# Patient Record
Sex: Female | Born: 1970 | Race: White | Hispanic: No | Smoking: Current every day smoker
Health system: Southern US, Community
[De-identification: ages and names within clinical notes are randomized; demographics above are authoritative.]

## PROBLEM LIST (undated history)

## (undated) DIAGNOSIS — F32A Depression, unspecified: Secondary | ICD-10-CM

## (undated) DIAGNOSIS — F419 Anxiety disorder, unspecified: Secondary | ICD-10-CM

## (undated) DIAGNOSIS — I10 Essential (primary) hypertension: Secondary | ICD-10-CM

## (undated) HISTORY — DX: Essential (primary) hypertension: I10

## (undated) HISTORY — DX: Anxiety disorder, unspecified: F41.9

## (undated) HISTORY — DX: Depression, unspecified: F32.A

## (undated) HISTORY — PX: ABDOMINAL HYSTERECTOMY: SHX81

---

## 2020-05-15 ENCOUNTER — Encounter (HOSPITAL_COMMUNITY): Payer: Self-pay | Admitting: Emergency Medicine

## 2020-05-15 ENCOUNTER — Emergency Department (HOSPITAL_COMMUNITY)
Admission: EM | Admit: 2020-05-15 | Discharge: 2020-05-16 | Disposition: A | Discharge status: Home / Self Care | Payer: BLUE CROSS/BLUE SHIELD | Source: Home / Self Care | Attending: Emergency Medicine | Admitting: Emergency Medicine

## 2020-05-15 ENCOUNTER — Other Ambulatory Visit: Payer: Self-pay

## 2020-05-15 DIAGNOSIS — R6510 Systemic inflammatory response syndrome (SIRS) of non-infectious origin without acute organ dysfunction: Secondary | ICD-10-CM | POA: Diagnosis not present

## 2020-05-15 DIAGNOSIS — Z79899 Other long term (current) drug therapy: Secondary | ICD-10-CM | POA: Insufficient documentation

## 2020-05-15 DIAGNOSIS — T424X1A Poisoning by benzodiazepines, accidental (unintentional), initial encounter: Secondary | ICD-10-CM | POA: Diagnosis not present

## 2020-05-15 DIAGNOSIS — F332 Major depressive disorder, recurrent severe without psychotic features: Secondary | ICD-10-CM | POA: Insufficient documentation

## 2020-05-15 DIAGNOSIS — F17210 Nicotine dependence, cigarettes, uncomplicated: Secondary | ICD-10-CM | POA: Insufficient documentation

## 2020-05-15 DIAGNOSIS — T50901A Poisoning by unspecified drugs, medicaments and biological substances, accidental (unintentional), initial encounter: Secondary | ICD-10-CM | POA: Insufficient documentation

## 2020-05-15 DIAGNOSIS — F411 Generalized anxiety disorder: Secondary | ICD-10-CM | POA: Diagnosis present

## 2020-05-15 LAB — COMPREHENSIVE METABOLIC PANEL
ALT: 23 U/L (ref 0–44)
AST: 32 U/L (ref 15–41)
Albumin: 4.6 g/dL (ref 3.5–5.0)
Alkaline Phosphatase: 104 U/L (ref 38–126)
Anion gap: 11 (ref 5–15)
BUN: 12 mg/dL (ref 6–20)
CO2: 28 mmol/L (ref 22–32)
Calcium: 9.1 mg/dL (ref 8.9–10.3)
Chloride: 103 mmol/L (ref 98–111)
Creatinine, Ser: 1.14 mg/dL — ABNORMAL HIGH (ref 0.44–1.00)
GFR calc Af Amer: >60 mL/min (ref >60)
GFR calc non Af Amer: 56 mL/min — ABNORMAL LOW (ref >60)
Glucose, Bld: 152 mg/dL — ABNORMAL HIGH (ref 70–99)
Potassium: 3.8 mmol/L (ref 3.5–5.1)
Sodium: 142 mmol/L (ref 135–145)
Total Bilirubin: 0.4 mg/dL (ref 0.3–1.2)
Total Protein: 8.3 g/dL — ABNORMAL HIGH (ref 6.5–8.1)

## 2020-05-15 LAB — CBC WITH DIFFERENTIAL/PLATELET
Abs Immature Granulocytes: 0.19 10*3/uL — ABNORMAL HIGH (ref 0.00–0.07)
Basophils Absolute: 0.2 10*3/uL — ABNORMAL HIGH (ref 0.0–0.1)
Basophils Relative: 1 %
Eosinophils Absolute: 0 10*3/uL (ref 0.0–0.5)
Eosinophils Relative: 0 %
HCT: 41.7 % (ref 36.0–46.0)
Hemoglobin: 13.8 g/dL (ref 12.0–15.0)
Immature Granulocytes: 1 %
Lymphocytes Relative: 12 %
Lymphs Abs: 3.1 10*3/uL (ref 0.7–4.0)
MCH: 29.2 pg (ref 26.0–34.0)
MCHC: 33.1 g/dL (ref 30.0–36.0)
MCV: 88.3 fL (ref 80.0–100.0)
Monocytes Absolute: 2.1 10*3/uL — ABNORMAL HIGH (ref 0.1–1.0)
Monocytes Relative: 8 %
Neutro Abs: 20.2 10*3/uL — ABNORMAL HIGH (ref 1.7–7.7)
Neutrophils Relative %: 78 %
Platelets: 300 10*3/uL (ref 150–400)
RBC: 4.72 MIL/uL (ref 3.87–5.11)
RDW: 16.1 % — ABNORMAL HIGH (ref 11.5–15.5)
WBC: 25.8 10*3/uL — ABNORMAL HIGH (ref 4.0–10.5)
nRBC: 0 % (ref 0.0–0.2)

## 2020-05-15 LAB — RAPID URINE DRUG SCREEN, HOSP PERFORMED
Amphetamines: NOT DETECTED
Barbiturates: NOT DETECTED
Benzodiazepines: NOT DETECTED
Cocaine: NOT DETECTED
Opiates: NOT DETECTED
Tetrahydrocannabinol: NOT DETECTED

## 2020-05-15 LAB — CBG MONITORING, ED: Glucose-Capillary: 169 mg/dL — ABNORMAL HIGH (ref 70–99)

## 2020-05-15 NOTE — ED Notes (Signed)
Morrie Sheldon- daughter [phone]

## 2020-05-15 NOTE — ED Triage Notes (Signed)
Pt arrived via EMS. EMS reports that pt denies taking anything, but EMS gave pt narcan 2mg  intranasal, and pt became responsive. Pt was found unresponsive with agonal breaths. Pt's grandson (50 years old) called someone who called 911.   Pt reports to me that she took 90.5mg  of clonapin several days ago.

## 2020-05-15 NOTE — ED Provider Notes (Signed)
[facility] DEPT Provider Note   CSN: [id number] Arrival date & time: 05/15/20  2229     History Chief Complaint  Patient presents with  . Ingestion    Crystal Campos is a 49 y.o. female.  Patient presents to the emergency department after she was found unresponsive.  Patient reports that she has had a headache for couple of days and got a pill from a friend of a friend which she took tonight and then was found unresponsive by her 58-year-old grandson who had to call 911.  Patient also reports that she has been taking large doses of Klonopin to "help her sleep" over the last few days.  She becomes tearful when she mentions that she lost her favorite aunt a couple of days ago.        History reviewed. No pertinent past medical history.  There are no problems to display for this patient.   Past Surgical History:  Procedure Laterality Date  . ABDOMINAL HYSTERECTOMY       OB History   No obstetric history on file.     History reviewed. No pertinent family history.  Social History   Tobacco Use  . Smoking status: Current Every Day Smoker    Packs/day: 1.00    Types: Cigarettes  . Smokeless tobacco: Current User  Substance Use Topics  . Alcohol use: Yes    Comment: once per year  . Drug use: Never    Comment: pt reports never taking illegal substances    Home Medications Prior to Admission medications   Medication Sig Start Date End Date Taking? Authorizing Provider  ADDERALL XR 30 MG 24 hr capsule Take 30 mg by mouth daily. 05/04/20  Yes [provider]  Buprenorphine HCl-Naloxone HCl 8-2 MG FILM Place under the tongue 2 (two) times daily. 05/04/20  Yes [provider]  busPIRone (BUSPAR) 30 MG tablet Take 30 mg by mouth 2 (two) times daily. 03/14/20  Yes [provider]  celecoxib (CELEBREX) 100 MG capsule Take 100 mg by mouth 2 (two) times daily as needed for moderate pain.  12/26/19  Yes [provider]  clonazePAM (KLONOPIN) 0.5 MG tablet Take 0.5 mg by mouth 3 (three) times daily as needed for anxiety.  05/12/20  Yes [provider]  diphenhydrAMINE (BENADRYL) 25 MG tablet Take 25 mg by mouth every 6 (six) hours as needed for allergies.   Yes [provider]  fluticasone (FLONASE) 50 MCG/ACT nasal spray Place 1 spray into both nostrils daily as needed for allergies or rhinitis.   Yes [provider]  pantoprazole (PROTONIX) 40 MG tablet Take 40 mg by mouth every morning. 04/08/20  Yes [provider]  Phenyleph-CPM-DM-Aspirin (ALKA-SELTZER PLUS COLD & COUGH PO) Take 1 tablet by mouth daily as needed (cold symptoms).   Yes [provider]  sertraline (ZOLOFT) 100 MG tablet Take 200 mg by mouth daily. 02/26/20  Yes [provider]  tiZANidine (ZANAFLEX) 4 MG tablet Take 4 mg by mouth every 6 (six) hours as needed for muscle spasms.  12/30/19  Yes [provider]  traZODone (DESYREL) 50 MG tablet Take 50 mg by mouth at bedtime as needed for sleep.  03/15/20  Yes [provider]    Allergies    Compazine [prochlorperazine] and Reglan [metoclopramide]  Review of Systems   Review of Systems  Psychiatric/Behavioral: Positive for dysphoric mood and sleep disturbance.  All other systems reviewed and are negative.   Physical Exam  Updated Vital Signs BP 131/82   Pulse 92   Temp 97.6 F (36.4 C) (Oral)   Resp 15   Ht 5' 3.5" (1.613 m)   Wt 63.5 kg   LMP  (LMP Unknown)   SpO2 92%   BMI 24.41 kg/m   Physical Exam Vitals and nursing note reviewed.  Constitutional:      General: She is not in acute distress.    Appearance: Normal appearance. She is well-developed.  HENT:     Head: Normocephalic and atraumatic.     Right Ear: Hearing normal.     Left Ear: Hearing normal.     Nose: Nose normal.  Eyes:     Conjunctiva/sclera: Conjunctivae normal.     Pupils: Pupils are equal, round, and reactive to light.    Cardiovascular:     Rate and Rhythm: Regular rhythm.     Heart sounds: S1 normal and S2 normal. No murmur. No friction rub. No gallop.   Pulmonary:     Effort: Pulmonary effort is normal. No respiratory distress.     Breath sounds: Normal breath sounds.  Chest:     Chest wall: No tenderness.  Abdominal:     General: Bowel sounds are normal.     Palpations: Abdomen is soft.     Tenderness: There is no abdominal tenderness. There is no guarding or rebound. Negative signs include Murphy's sign and McBurney's sign.     Hernia: No hernia is present.  Musculoskeletal:        General: Normal range of motion.     Cervical back: Normal range of motion and neck supple.  Skin:    General: Skin is warm and dry.     Findings: No rash.  Neurological:     Mental Status: She is alert and oriented to person, place, and time.     GCS: GCS eye subscore is 4. GCS verbal subscore is 5. GCS motor subscore is 6.     Cranial Nerves: No cranial nerve deficit.     Sensory: No sensory deficit.     Coordination: Coordination normal.  Psychiatric:        Mood and Affect: Affect is tearful.        Speech: Speech normal.        Behavior: Behavior normal.     ED Results / Procedures / Treatments   Labs (all labs ordered are listed, but only abnormal results are displayed) Labs Reviewed  COMPREHENSIVE METABOLIC PANEL - Abnormal; Notable for the following components:      Result Value   Glucose, Bld 152 (*)    Creatinine, Ser 1.14 (*)    Total Protein 8.3 (*)    GFR calc non Af Amer 56 (*)    All other components within normal limits  SALICYLATE LEVEL - Abnormal; Notable for the following components:   Salicylate Lvl <7.0 (*)    All other components within normal limits  ACETAMINOPHEN LEVEL - Abnormal; Notable for the following components:   Acetaminophen (Tylenol), Serum <10 (*)    All other components within normal limits  CBC WITH DIFFERENTIAL/PLATELET - Abnormal; Notable for the following  components:   WBC 25.8 (*)    RDW 16.1 (*)    Neutro Abs 20.2 (*)    Monocytes Absolute 2.1 (*)    Basophils Absolute 0.2 (*)    Abs Immature Granulocytes 0.19 (*)    All other components within normal limits  CBG MONITORING, ED - Abnormal; Notable for the following  components:   Glucose-Capillary 169 (*)    All other components within normal limits  ETHANOL  RAPID URINE DRUG SCREEN, HOSP PERFORMED    EKG EKG Interpretation  Date/Time:  Sunday May 15 2020 23:00:27 EDT Ventricular Rate:  111 PR Interval:    QRS Duration: 85 QT Interval:  354 QTC Calculation: 481 R Axis:   79 Text Interpretation: Sinus tachycardia LAE, consider biatrial enlargement Low voltage, precordial leads Confirmed by Gilda Crease ([id number]) on 05/15/2020 11:09:12 PM   Radiology DG Chest Port 1 View  Result Date: 05/16/2020 CLINICAL DATA:  Found unresponsive EXAM: PORTABLE CHEST 1 VIEW COMPARISON:  None. FINDINGS: The heart size and mediastinal contours are within normal limits. Both lungs are clear. The visualized skeletal structures are unremarkable. IMPRESSION: No active disease. Electronically Signed   By: Deatra Robinson M.D.   On: 05/16/2020 01:11    Procedures Procedures (including critical care time)  Medications Ordered in ED Medications - No data to display  ED Course  I have reviewed the triage vital signs and the nursing notes.  Pertinent labs & imaging results that were available during my care of the patient were reviewed by me and considered in my medical decision making (see chart for details).    MDM Rules/Calculators/A&P                      Patient presents to the emergency department for evaluation of what appears to be an overdose.  Patient reports taking a pill that she got from someone she does not know.  She became unresponsive after taking this pill and required Narcan to become alert again.  Family reports that she does have a history of drug addiction and abuse  problems.  She reports to taking extra Klonopin but states that she has not been suicidal.  She did, however, report to me that one of her aunts that she is very close to died in the last couple of days and she is distraught over this.  Therefore had behavioral health evaluation to help determine intent of this overdose and need for follow-up.  She has been reviewed by TTS and recommended to have repeat evaluation in the morning.  Final Clinical Impression(s) / ED Diagnoses Final diagnoses:  Accidental drug overdose, initial encounter    Rx / DC Orders ED Discharge Orders    None       Manasseh Pittsley, Canary Brim, MD 05/16/20 [phone]

## 2020-05-16 ENCOUNTER — Encounter (HOSPITAL_COMMUNITY): Payer: Self-pay

## 2020-05-16 ENCOUNTER — Other Ambulatory Visit: Payer: Self-pay

## 2020-05-16 ENCOUNTER — Emergency Department (HOSPITAL_COMMUNITY): Payer: BLUE CROSS/BLUE SHIELD

## 2020-05-16 ENCOUNTER — Encounter (HOSPITAL_COMMUNITY): Payer: Self-pay | Admitting: Registered Nurse

## 2020-05-16 ENCOUNTER — Inpatient Hospital Stay (HOSPITAL_COMMUNITY)
Admission: EM | Admit: 2020-05-16 | Discharge: 2020-05-19 | DRG: 917 | Disposition: A | Discharge status: Home / Self Care | Payer: BLUE CROSS/BLUE SHIELD | Attending: Internal Medicine | Admitting: Internal Medicine

## 2020-05-16 DIAGNOSIS — F411 Generalized anxiety disorder: Secondary | ICD-10-CM | POA: Diagnosis present

## 2020-05-16 DIAGNOSIS — G92 Toxic encephalopathy: Secondary | ICD-10-CM | POA: Diagnosis present

## 2020-05-16 DIAGNOSIS — F329 Major depressive disorder, single episode, unspecified: Secondary | ICD-10-CM | POA: Diagnosis present

## 2020-05-16 DIAGNOSIS — F17210 Nicotine dependence, cigarettes, uncomplicated: Secondary | ICD-10-CM | POA: Diagnosis present

## 2020-05-16 DIAGNOSIS — R6510 Systemic inflammatory response syndrome (SIRS) of non-infectious origin without acute organ dysfunction: Secondary | ICD-10-CM | POA: Diagnosis present

## 2020-05-16 DIAGNOSIS — G5630 Lesion of radial nerve, unspecified upper limb: Secondary | ICD-10-CM | POA: Diagnosis present

## 2020-05-16 DIAGNOSIS — J9601 Acute respiratory failure with hypoxia: Secondary | ICD-10-CM | POA: Diagnosis present

## 2020-05-16 DIAGNOSIS — I2510 Atherosclerotic heart disease of native coronary artery without angina pectoris: Secondary | ICD-10-CM | POA: Diagnosis present

## 2020-05-16 DIAGNOSIS — Z79899 Other long term (current) drug therapy: Secondary | ICD-10-CM

## 2020-05-16 DIAGNOSIS — E876 Hypokalemia: Secondary | ICD-10-CM | POA: Diagnosis present

## 2020-05-16 DIAGNOSIS — J690 Pneumonitis due to inhalation of food and vomit: Secondary | ICD-10-CM

## 2020-05-16 DIAGNOSIS — D72828 Other elevated white blood cell count: Secondary | ICD-10-CM | POA: Diagnosis present

## 2020-05-16 DIAGNOSIS — Z20822 Contact with and (suspected) exposure to covid-19: Secondary | ICD-10-CM | POA: Diagnosis present

## 2020-05-16 DIAGNOSIS — D72829 Elevated white blood cell count, unspecified: Secondary | ICD-10-CM

## 2020-05-16 DIAGNOSIS — K219 Gastro-esophageal reflux disease without esophagitis: Secondary | ICD-10-CM | POA: Diagnosis present

## 2020-05-16 DIAGNOSIS — M21331 Wrist drop, right wrist: Secondary | ICD-10-CM | POA: Diagnosis present

## 2020-05-16 DIAGNOSIS — M4854XA Collapsed vertebra, not elsewhere classified, thoracic region, initial encounter for fracture: Secondary | ICD-10-CM | POA: Diagnosis present

## 2020-05-16 DIAGNOSIS — J9602 Acute respiratory failure with hypercapnia: Secondary | ICD-10-CM | POA: Diagnosis present

## 2020-05-16 DIAGNOSIS — R000 Tachycardia, unspecified: Secondary | ICD-10-CM | POA: Diagnosis present

## 2020-05-16 DIAGNOSIS — Z9081 Acquired absence of spleen: Secondary | ICD-10-CM

## 2020-05-16 DIAGNOSIS — N179 Acute kidney failure, unspecified: Secondary | ICD-10-CM | POA: Diagnosis present

## 2020-05-16 DIAGNOSIS — T424X1A Poisoning by benzodiazepines, accidental (unintentional), initial encounter: Secondary | ICD-10-CM

## 2020-05-16 DIAGNOSIS — N390 Urinary tract infection, site not specified: Secondary | ICD-10-CM | POA: Diagnosis present

## 2020-05-16 DIAGNOSIS — I700 Atherosclerosis of aorta: Secondary | ICD-10-CM | POA: Diagnosis present

## 2020-05-16 DIAGNOSIS — R739 Hyperglycemia, unspecified: Secondary | ICD-10-CM | POA: Diagnosis present

## 2020-05-16 LAB — ETHANOL: Alcohol, Ethyl (B): <10 mg/dL (ref <10)

## 2020-05-16 LAB — SALICYLATE LEVEL: Salicylate Lvl: <7 mg/dL — ABNORMAL LOW (ref 7.0–30.0)

## 2020-05-16 LAB — I-STAT BETA HCG BLOOD, ED (MC, WL, AP ONLY): I-stat hCG, quantitative: 5 m[IU]/mL — ABNORMAL HIGH (ref <5)

## 2020-05-16 LAB — ACETAMINOPHEN LEVEL: Acetaminophen (Tylenol), Serum: <10 ug/mL — ABNORMAL LOW (ref 10–30)

## 2020-05-16 NOTE — ED Provider Notes (Signed)
[facility]-EMERGENCY DEPT Provider Note   CSN: [id number] Arrival date & time: 05/16/20  2252     History Chief Complaint  Patient presents with  . Drug Overdose    Crystal Campos is a 49 y.o. female presented emergency department with concerns for altered mental status.    The patient was just discharged emergency department earlier this morning after behavioral health hold overnight, with concerns for accidental drug overdose with Klonopin or "another pill from a friend" per her history (note: UDS was negative) . This was felt to be unintentional and the patient expressed interest in enrollment in an outpatient clinic.  Her daughter did not feel this was an intentional overdose but had noted a similar possible overdose 2 months prior to this.  Today, the patient reports that she went home from the ER this morning and laid down on the couch feeling "tired."  She was watching her 73 year old grandson and has been helping watch him as her daughter has a busy work schedule.  Initially the patent reported she did NOT take any drugs or medications, but later the patient told me she took "one klonopin to help me sleep."  Her next recollection is waking up in the ED now.  Additional history provided by the patient's daughter Crystal Campos by phone, who lives with her.  Ms Crystal Campos is a first provider works with the police.  She reports that she left her mother to watch her 20-year-old earlier today on the couch.  Her mother has been caring for her grandson for some months now due to her work schedule and they haven't had any major issues at home. When [name] returned home this evening she found her mother slumped over on the stairs, with blood and mucus coming out of her right nostril.  "She wasn't responding and she had agonal breathing, but she did have pulses."  Crystal Campos immediately got narcan from her patrol car and gave it intranasal.  She then called 911 and was told to initiate  CPR, which she did. EMS arrived and gave another dose of narcan with no improvement in patient's mental status.    Her daughter tells me that the patient had similar issues with suspected overdose 2 months ago, found slumped in her car in [place].  Her mother has been on klonopin and other meds for years and usually takes her own medications.  Crystal Campos noticed a crushed yellow powder in her mother's bathroom sink, but has never seen her mother snort any drugs.  She says she is unaware of any active suicidal ideation from her mother.     HPI     History reviewed. No pertinent past medical history.  Patient Active Problem List   Diagnosis Date Noted  . SIRS (systemic inflammatory response syndrome) (HCC) 05/17/2020  . 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 Acute lower UTI 05/17/2020  . GAD (generalized anxiety disorder) 05/16/2020    Past Surgical History:  Procedure Laterality Date  . ABDOMINAL HYSTERECTOMY       OB History   No obstetric history on file.     No family history on file.  Social History   Tobacco Use  . Smoking status: Current Every Day Smoker    Packs/day: 1.00    Types: Cigarettes  . Smokeless tobacco: Current User  Substance Use Topics  . Alcohol use: Yes    Comment: once per year  . Drug use: Never    Comment: pt reports never taking illegal  substances    Home Medications Prior to Admission medications   Medication Sig Start Date End Date Taking? Authorizing Provider  ADDERALL XR 30 MG 24 hr capsule Take 30 mg by mouth daily. 05/04/20   [provider]  Buprenorphine HCl-Naloxone HCl 8-2 MG FILM Place under the tongue 2 (two) times daily. 05/04/20   [provider]  busPIRone (BUSPAR) 30 MG tablet Take 30 mg by mouth 2 (two) times daily. 03/14/20   [provider]  celecoxib (CELEBREX) 100 MG capsule Take 100 mg by mouth 2 (two) times daily as needed for moderate pain.  12/26/19   [provider]  clonazePAM (KLONOPIN)  0.5 MG tablet Take 0.5 mg by mouth 3 (three) times daily as needed for anxiety.  05/12/20   [provider]  diphenhydrAMINE (BENADRYL) 25 MG tablet Take 25 mg by mouth every 6 (six) hours as needed for allergies.    [provider]  fluticasone (FLONASE) 50 MCG/ACT nasal spray Place 1 spray into both nostrils daily as needed for allergies or rhinitis.    [provider]  pantoprazole (PROTONIX) 40 MG tablet Take 40 mg by mouth every morning. 04/08/20   [provider]  Phenyleph-CPM-DM-Aspirin (ALKA-SELTZER PLUS COLD & COUGH PO) Take 1 tablet by mouth daily as needed (cold symptoms).    [provider]  sertraline (ZOLOFT) 100 MG tablet Take 200 mg by mouth daily. 02/26/20   [provider]  tiZANidine (ZANAFLEX) 4 MG tablet Take 4 mg by mouth every 6 (six) hours as needed for muscle spasms.  12/30/19   [provider]  traZODone (DESYREL) 50 MG tablet Take 50 mg by mouth at bedtime as needed for sleep.  03/15/20   [provider]    Allergies    Compazine [prochlorperazine] and Reglan [metoclopramide]  Review of Systems   Review of Systems  Constitutional: Negative for chills and fever.  Eyes: Negative for pain and visual disturbance.  Respiratory: Negative for cough and shortness of breath.   Cardiovascular: Positive for chest pain. Negative for palpitations.  Gastrointestinal: Negative for abdominal pain and vomiting.  Skin: Negative for color change and rash.  Neurological: Positive for headaches. Negative for syncope.  Psychiatric/Behavioral: Positive for agitation and confusion.  All other systems reviewed and are negative.   Physical Exam Updated Vital Signs BP 127/76   Pulse 96   Temp 98.1 F (36.7 C) (Oral)   Resp (!) 9   LMP  (LMP Unknown)   SpO2 99%   Physical Exam Vitals and nursing note reviewed.  Constitutional:      General: She is not in acute distress.    Appearance: She is well-developed.      Comments: Groggy, arousable Pupils rolling around   HENT:     Head: Normocephalic and atraumatic.     Comments: Dried blood in right nares Eyes:     Conjunctiva/sclera: Conjunctivae normal.     Pupils: Pupils are equal, round, and reactive to light.     Comments: Pupils 3 mm reactive bilaterally  Cardiovascular:     Rate and Rhythm: Regular rhythm. Tachycardia present.     Comments: Anterior chest wall tenderness Pulmonary:     Effort: Pulmonary effort is normal. No respiratory distress.     Breath sounds: Normal breath sounds.     Comments: 90% on room air, 98% on 2L [place] Abdominal:     General: There is no distension.     Palpations: Abdomen is soft.  Tenderness: There is no abdominal tenderness.  Musculoskeletal:     Cervical back: Neck supple.  Skin:    General: Skin is warm and dry.  Neurological:     General: No focal deficit present.     Mental Status: She is alert and oriented to person, place, and time.     Comments: Somnolent initially but had improvement of mental status after 2 hours, GCS 15, AAO x 3 Right wrist drop noted on exam, new per patient No other acute neurological deficits     ED Results / Procedures / Treatments   Labs (all labs ordered are listed, but only abnormal results are displayed) Labs Reviewed  COMPREHENSIVE METABOLIC PANEL - Abnormal; Notable for the following components:      Result Value   Chloride 97 (*)    Glucose, Bld 290 (*)    Creatinine, Ser 1.14 (*)    Calcium 8.8 (*)    Total Protein 8.2 (*)    AST 44 (*)    GFR calc non Af Amer 56 (*)    All other components within normal limits  CBC - Abnormal; Notable for the following components:   WBC 29.0 (*)    RDW 16.3 (*)    All other components within normal limits  RAPID URINE DRUG SCREEN, HOSP PERFORMED - Abnormal; Notable for the following components:   Benzodiazepines POSITIVE (*)    All other components within normal limits  URINALYSIS, ROUTINE W REFLEX MICROSCOPIC -  Abnormal; Notable for the following components:   Glucose, UA >=500 (*)    Leukocytes,Ua LARGE (*)    Bacteria, UA RARE (*)    All other components within normal limits  BLOOD GAS, VENOUS - Abnormal; Notable for the following components:   pH, Ven 7.242 (*)    pCO2, Ven 69.1 (*)    Bicarbonate 28.7 (*)    All other components within normal limits  I-STAT BETA HCG BLOOD, ED ([facility], [facility], AP ONLY) - Abnormal; Notable for the following components:   I-stat hCG, quantitative 5.0 (*)    All other components within normal limits  CULTURE, BLOOD (ROUTINE X 2)  CULTURE, BLOOD (ROUTINE X 2)  ETHANOL  LACTIC ACID, PLASMA  PROCALCITONIN  LACTIC ACID, PLASMA  APTT  PROTIME-INR  HEMOGLOBIN A1C  TSH  VITAMIN B12  HIV ANTIBODY (ROUTINE TESTING W REFLEX)  CBC  COMPREHENSIVE METABOLIC PANEL  CBC WITH DIFFERENTIAL/PLATELET    EKG EKG Interpretation  Date/Time:  Monday May 16 2020 23:15:06 EDT Ventricular Rate:  109 PR Interval:    QRS Duration: 86 QT Interval:  360 QTC Calculation: 485 R Axis:   71 Text Interpretation: Sinus tachycardia No STEMI Confirmed by Octaviano Glow [id number]) on 05/16/2020 11:18:35 PM   Radiology CT Head Wo Contrast  Result Date: 05/16/2020 CLINICAL DATA:  Altered mental status. EXAM: CT HEAD WITHOUT CONTRAST TECHNIQUE: Contiguous axial images were obtained from the base of the skull through the vertex without intravenous contrast. COMPARISON:  None. FINDINGS: Brain: No evidence of acute infarction, hemorrhage, hydrocephalus, extra-axial collection or mass lesion/mass effect. There is a cisterna magna. Vascular: No hyperdense vessel or unexpected calcification. Skull: Normal. Negative for fracture or focal lesion. Sinuses/Orbits: There is some mild mucosal thickening of the right maxillary sinus. Otherwise, the remaining paranasal sinuses and mastoid air cells are essentially clear. Other: None. IMPRESSION: No acute intracranial abnormality. Electronically Signed   By:  Constance Holster M.D.   On: 05/16/2020 23:40   CT Chest Wo Contrast  Result  Date: 05/16/2020 CLINICAL DATA:  Status post CPR. Evaluate for aspiration or broken ribs. EXAM: CT CHEST WITHOUT CONTRAST TECHNIQUE: Multidetector CT imaging of the chest was performed following the standard protocol without IV contrast. COMPARISON:  Chest x-ray earlier today FINDINGS: Cardiovascular: Heart is normal size. Aorta is normal caliber. Scattered coronary artery and aortic calcifications. Mediastinum/Nodes: No mediastinal, hilar, or axillary adenopathy. Trachea and esophagus are unremarkable. Thyroid unremarkable. Lungs/Pleura: Linear atelectasis anteriorly in the right upper lobe. Minimal ground-glass opacities in the right lower lobe and right middle lobe inferiorly. No confluent opacities or effusions. No pneumothorax. Upper Abdomen: Imaging into the upper abdomen shows no acute findings. Musculoskeletal: Compression fracture of the T4 vertebral body with kyphosis. Old healed manubrial fracture. No visible rib fractures. Chest wall soft tissues unremarkable. IMPRESSION: No visible rib fracture or evidence of aspiration. Scattered coronary artery and aortic atherosclerosis. Moderate compression fracture with kyphosis at T4. This is age indeterminate but favor chronic. Electronically Signed   By: Charlett Nose M.D.   On: 05/16/2020 23:37   DG Chest Port 1 View  Result Date: 05/16/2020 CLINICAL DATA:  Found unresponsive EXAM: PORTABLE CHEST 1 VIEW COMPARISON:  None. FINDINGS: The heart size and mediastinal contours are within normal limits. Both lungs are clear. The visualized skeletal structures are unremarkable. IMPRESSION: No active disease. Electronically Signed   By: Deatra Robinson M.D.   On: 05/16/2020 01:11    Procedures .Critical Care Performed by: Terald Sleeper, MD Authorized by: Terald Sleeper, MD   Critical care provider statement:    Critical care time (minutes):  55   Critical care was  necessary to treat or prevent imminent or life-threatening deterioration of the following conditions:  [place], respiratory failure and sepsis   Critical care was time spent personally by me on the following activities:  Discussions with consultants, evaluation of patient's response to treatment, examination of patient, ordering and performing treatments and interventions, ordering and review of laboratory studies, ordering and review of radiographic studies, pulse oximetry, re-evaluation of patient's condition, obtaining history from patient or surrogate and review of old charts Comments:     Suspected drug overdose and hypoxia requiring repeat bedside reassessment, supplemental oxygen, sepsis w/u with IV antibiotics and fluids   (including critical care time)  Medications Ordered in ED Medications  Ampicillin-Sulbactam (UNASYN) 3 g in sodium chloride 0.9 % 100 mL IVPB (3 g Intravenous New Bag/Given 05/17/20 0155)  0.9 %  sodium chloride infusion ( Intravenous New Bag/Given 05/17/20 0156)  heparin injection 5,000 Units (has no administration in time range)  acetaminophen (TYLENOL) tablet 650 mg (has no administration in time range)    Or  acetaminophen (TYLENOL) suppository 650 mg (has no administration in time range)  polyethylene glycol (MIRALAX / GLYCOLAX) packet 17 g (has no administration in time range)  ipratropium (ATROVENT) nebulizer solution 0.5 mg (has no administration in time range)  Ampicillin-Sulbactam (UNASYN) 3 g in sodium chloride 0.9 % 100 mL IVPB (has no administration in time range)  sodium chloride 0.9 % bolus 1,000 mL (0 mLs Intravenous Stopped 05/17/20 0155)    ED Course  I have reviewed the triage vital signs and the nursing notes.  Pertinent labs & imaging results that were available during my care of the patient were reviewed by me and considered in my medical decision making (see chart for details).  49 year old female presenting to ED with episode of altered  mental status.  She was seen in the ER overnight last night for similar  presentation, and has had 1 other episode 2 months ago of being unresponsive in her car.  In these cases she reports taking a "pill" from a friend.  Yesterday her UDS was negative for benzos, although she has been taking klonopin for years.  She was discharged home as a suspected accidental overdose or drug interaction, with plan for outpatient support.  Tonight she returns somnolent, agonal breathing at home, undergoing CPR per her daughter.  No response to narcan intranasal x 2 at home from daughter and EMS.  Confused on arrival, but with gradual improvement in her mental status while here.  A CTH shows no acute bleed or infarct.  A CT chest shows no rib fractures, but atelectasis in the right upper lobe and possible smaller opacities in bilateral fields.  This is consistent with a possible aspiration event.  The patient is also hypoxic to 87% on room air requiring 2L [place] supplemental O2.  She had a leukocytosis of 26K yesterday, increased to 29K today.  This could be consistent with reactive leukocytosis from her initial overdose, compounded by aspiration PNA and CPR reactive leukocytosis today.  However I could not exclude the possibility of another underlying infection causing her confusion and lethargy yesterday and today.  I felt that broadening her to a sepsis w/u was reasonable.  This includes COVID screening.  Her UA has leukocytes and bacteria (straight cath sample) but no nitrites - cannot exclude UTI entirely.  VBG shows some CO2 retention, which may contribute to her initial confusion  EKG on arrival per my interpretation shows sinus tachycardia, which is consistent with her prior presentation.  No evidence of infarct.    Doubtful of PE clinically with this history and presentation.  More likely aspiration.  Finally, she has a right wrist drop, which appears to be new for her.  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  No sign of stroke on CT head.  We'll place her wrist in a supportive splint here.  The inpatient team could consider a course of steroids at discharge, but this can take 4-6 weeks to improve.  Clinical Course as of May 17 212  Mon May 16, 2020  2346 No acute findings on [facility]   [MT]  Tue May 17, 2020  0049 Benzodiazepines(!): POSITIVE [MT]  0059 Patient is now wide-awake.  She is now telling me that "I did take a Klonopin for sleeping when I got home."  I explained a single Klonopin dose would not cause her symptoms.  She told me it is possible that she got up and took additional doses, but she does not know what she is doing when she does this.  She adamantly denies that she is having suicidal ideations.   [MT]  0100 On my reexam she is also noted to have a right wrist drop, concerning for radial nerve palsy.  This may been related to compression of her peripheral nerves from the history provided tonight.  There is no evidence of stroke on the CT scan.   [MT]  0106 Unable to reach her daughter on the phone.  I think the best course of action may be observation overnight at this point.  Patient has improvement of her mental status but continues to overall be confused.  She is tachycardic with a leukocytosis, both of which could be reactive to her overdose and chest compressions, possible benzo withdrawal symptoms, but I think this warrants further monitoring.  If  she is stable in the morning she could be discharged home... but there needs to be a clear discussion with her daughter to manage her medications and particularly Klonopin from here on out.   [MT]  0117 Broadening the patient to a full sepsis work-up.  We'll start IV vanco + zosyn.    [MT]  0141 Admitted to the hospitalist.  After a discussion we agreed to narrow antibiotics to unasyn for possible aspiration vs UTI (covid also remains on the differential).     [MT]    Clinical Course  User Index [MT] Terald Sleeper, MD    Final Clinical Impression(s) / ED Diagnoses Final diagnoses:  Benzodiazepine overdose, accidental or unintentional, initial encounter  Tachycardia  Leukocytosis, unspecified type  Right wrist drop  Aspiration pneumonia of right upper lobe, unspecified aspiration pneumonia type [place])    Rx / DC Orders ED Discharge Orders    None       Terald Sleeper, MD 05/17/20 [phone]

## 2020-05-16 NOTE — Discharge Instructions (Signed)
For your behavioral health needs, you are advised to follow up with the Partial Hospitalization Program (PHP) at the [facility] [place].  This program meets Monday - Friday from 9:00 am - 2:00 pm.  You are scheduled for an intake appointment on Wednesday, May 18, 2020.  If you have any questions, contact Milana Na, [facility] or Donia Guiles, LCSW at the phone number indicated below:       [facility]. [employer]. [address] [place], [place] [address]      Contact person: Milana Na, [facility]                                   [place], [place]      [phone]

## 2020-05-16 NOTE — ED Notes (Signed)
TTS consultant recommends pt being re-assessed in a few hours with peer support.

## 2020-05-16 NOTE — ED Provider Notes (Signed)
Dr. Oletta Cohn evaluated the patient in the evening and determined she needed TTS consultation.  Consult has been done and patient is cleared by psychiatry for discharge.  They have given resources.  I evaluate the patient and she reports that she feels well at this time and is ready to go home. Physical Exam  BP 115/81   Pulse 80   Temp 97.6 F (36.4 C) (Oral)   Resp 17   Ht 5' 3.5" (1.613 m)   Wt 63.5 kg   LMP  (LMP Unknown)   SpO2 94%   BMI 24.41 kg/m   Physical Exam Patient is alert and nontoxic.  She is sitting at the edge of stretcher awake and alert.  Eye contact is good.  No respiratory distress.  Clinically well in appearance. ED Course/Procedures     Procedures  MDM  TTS was consulted and made arrangements for follow-up plan.  Patient has resources at bedside.  She is alert and appropriate.  No signs of somnolence or confusion from medications.  Eye contact is good.  Stable for discharge.       Arby Barrette, MD 05/16/20 1145

## 2020-05-16 NOTE — ED Triage Notes (Signed)
Per ems: Pt coming from home after "reaction between her trazodone and celebrex" Pt had pinpoint pupils and given 2mg  of narcan by offduty gpd (daughter). Pt bagged on arrival by ems and cpr performed by family prior to ems. Seen earlier today for similar thing.   4mg  zofran given

## 2020-05-16 NOTE — BHH Suicide Risk Assessment (Cosign Needed)
Suicide Risk Assessment  Discharge Assessment   [facility] Discharge Suicide Risk Assessment   Principal Problem: GAD (generalized anxiety disorder) Discharge Diagnoses: Principal Problem:   GAD (generalized anxiety disorder)   Total Time spent with patient: 45 minutes  Musculoskeletal: Strength & Muscle Tone: within normal limits Gait & Station: normal Patient leans: N/A  Psychiatric Specialty Exam:   Blood pressure 115/81, pulse 80, temperature 97.6 F (36.4 C), temperature source Oral, resp. rate 17, height 5' 3.5" (1.613 m), weight 63.5 kg, SpO2 94 %.Body mass index is 24.41 kg/m.  General Appearance: Casual  Eye Contact::  Good  Speech:  Clear and Coherent and Normal Rate409  Volume:  Normal  Mood:  "Better"  Affect:  Appropriate and Congruent  Thought Process:  Coherent, Goal Directed and Descriptions of Associations: Intact  Orientation:  Full (Time, Place, and Person)  Thought Content:  WDL and Logical  Suicidal Thoughts:  No  Homicidal Thoughts:  No  Memory:  Immediate;   Good Recent;   Good Remote;   Good  Judgement:  Intact  Insight:  Present  Psychomotor Activity:  Normal  Concentration:  Good  Recall:  Good  Fund of Knowledge:Good  Language: Good  Akathisia:  No  Handed:  Right  AIMS (if indicated):     Assets:  Communication Skills Desire for Improvement Financial Resources/Insurance Housing Resilience Social Support Transportation  Sleep:     Cognition: WNL  ADL's:  Intact   Mental Status Per Nursing Assessment::   On Admission:    Crystal Campos, 49 y.o., female patient seen via tele psych by this provider, Dr. Lucianne Muss; and chart reviewed on 05/16/20.  On evaluation Crystal Campos reports she was feeling overwhelmed related to losing her favorite aunt recently and the loss of her husband in 04/27/2013.  States that she is feeling better today and is no longer having passive suicidal thoughts.  Also states that she is not miss using her Klonopin; "I have  taken an extra one to help me sleep since the death of my aunt, not over using or trying to kill myself."  Patient states that she is interested in the IOP program.  States that she is living with her daughter who is supportive and gave permission to speak with for collateral information During evaluation Crystal Campos is alert/oriented x 4; calm/cooperative; and mood is congruent with affect.  She does not appear to be responding to internal/external stimuli or delusional thoughts.  Patient denies suicidal/self-harm/homicidal ideation, psychosis, and paranoia.  Patient answered question appropriately.   Collateral Information:  Spoke to patients daughter [name] at [phone] feels that patient will be safe coming back home; states that she will be with patient for few days prior to her going back to work; also agrees that the IOP program will be helpful.  States that she will be picking patient up from hospital.    Demographic Factors:  Caucasian  Loss Factors: Recent death of her favorite aunt and death of her husband in 04-27-2013  Historical Factors: NA  Risk Reduction Factors:   Sense of responsibility to family, Religious beliefs about death, Living with another person, especially a relative and Positive social support  Continued Clinical Symptoms:  Previous Psychiatric Diagnoses and Treatments  Cognitive Features That Contribute To Risk:  None    Suicide Risk:  Minimal: No identifiable suicidal ideation.  Patients presenting with no risk factors but with morbid ruminations; may be classified as minimal risk based on the severity of the depressive symptoms  Plan Of Care/Follow-up recommendations:  Activity:  As tolerated Diet:  Heart healthy     Discharge Instructions     For your behavioral health needs, you are advised to follow up with the Partial Hospitalization Program (PHP) at the [facility] [place].  This program meets  Monday - Friday from 9:00 am - 2:00 pm.  You are scheduled for an intake appointment on Wednesday, May 18, 2020.  If you have any questions, contact Loistine Chance, [facility] or Lorin Glass, LCSW at the phone number indicated below:       [facility]. [employer]. [place],  [id number]      Contact person: Loistine Chance, [place], [place]      [phone]    Disposition:  Psychiatrically cleared No evidence of imminent risk to self or others at present.   Patient does not meet criteria for psychiatric inpatient admission. Supportive therapy provided about ongoing stressors. Refer to IOP. Discussed crisis plan, support from social network, calling 911, coming to the Emergency Department, and calling Suicide Hotline.   Shuvon Rankin, NP 05/16/2020, 11:23 AM

## 2020-05-16 NOTE — BH Assessment (Addendum)
BHH Assessment Progress Note  Per Nelly Rout, MD, this voluntary Crystal Campos does not require psychiatric hospitalization at this time.  Crystal Campos is to be discharged from [facility] with outpatient referrals.  Dr Lucianne Muss recommends the Partial Hospitalization Program at the [facility] [place].  This Clinical research associate spoke to Crystal Campos about the program, and Crystal Campos  wants to enroll.  Crystal Campos reports having Internet connectivity at home, as well as a suitable device for virtual programming.  At 10:56 I spoke to [facility], [facility], and she has scheduled Crystal Campos for intake on Wednesday, 05/18/2020 at 10:00.  This has been included in Crystal Campos's discharge instructions.  Crystal Campos has been given a registration packet and has been asked to complete it prior to the appointment.  Crystal Campos's e-mail address and current phone number have been sent to [facility] by email.  Crystal Campos's nurse has been notified.  Doylene Canning, MA Triage Specialist [phone]   Addendum:  Crystal Campos has been given a registration packet, which she has been asked to complete prior to the intake appointment.  Doylene Canning, [place] Behavioral Health Coordinator [phone]

## 2020-05-16 NOTE — BH Assessment (Addendum)
Tele Assessment Note   Patient Name: Crystal Campos MRN: [id number] Referring Physician: Dr. Jaci Carrel.  Location of Patient: [facility], [facility].  Location of Provider: Behavioral Health TTS Department  Crystal Campos is an 49 y.o. female, who presents voluntary and unaccompanied to [facility]. Clinician asked the pt, "what brought you to the hospital?" Pt reported, she took a pill she got from a friend for a migraine, and she doesn't remember anything after that. Pt does not know what medication she was given. Pt reported, she's been taking large doses of Klonopin to help her sleep for the last few days. Pt reported, she moved in with her daughter, grandson and daughter's boyfriend in December 2020. 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 Pt became tearful when discussing loosing her favorite aunt a couple days ago and how she worries about her grandson wanting him to spend more time with his mother. Pt reported, depressive symptoms and weekly panic attacks (more if she's not taking her medication.) Pt denies, trying to kill herself, pt express she would never try to hurt herself. Pt denies, SI, HI, AVH, self-injurious behaviors and access to weapons.   Pt consented for clinician to call her daughter Crystal Campos, [phone]) to obtain collateral information. Pt went to [place], [place] on Friday evening and came back today (05/15/2020). Per daughter, the pt seemed fine, she received a text around 1930 saying she was at home. Pt's daughter reported, hours later while at work she got a FaceTime from her 54 year old son hysterical saying "Mimi is asleep and can't wake up." Pt's daughter reported, she asked her son to show her the pt, she was laying on the living room floor; she called 911. Pt's daughter reported, the pt has a history of substance use (pills). Per daughter the pt is diagnosed with anxiety and depression but she is unsure of what  medication she is prescribed. Pt's daughter reported, she does not think the pt tried to intentionally hurt herself. Per daughter the pt has been doing really well lately but she feels when she went to [place] over the weekend a "friend gave her something." Pt's daughter reported, two months ago while the was in [place] she was found unresponsive in her car in a parking lot and Narcan was administered. Per daughter, she wants to the pt to be linked to therapy and help for substance use.   Pt denies substance use. Pt's UDS is negative. Pt denies, being linked to OPT resources (medication management and/or counseling.) Pt reported, her PCP in [place] prescribed her medications. Pt reported, a previous inpatient admission, years ago for depression at [facility].   Pt presents tearful at times in a hospital gown. Pt's eye contact was good. Pt's mood was depressed, anxious. Pt's affect was congruent with mood. Pt's thought process was coherent, relevant. Pt's judgment was partial. Pt was oriented x3. Pt's concentration was normal. Pt's insight and impulse control are poor. Pt reported, if discharged from [facility] she could contract for safety. Pt reported, she prefers OPT than being admitted.   Diagnosis: Major Depressive Disorder, recurrent, severe, without psychotic features.                     GAD.   Past Medical History: History reviewed. No pertinent past medical history.  Past Surgical History:  Procedure Laterality Date  . ABDOMINAL HYSTERECTOMY      Family History: History reviewed. No pertinent  family history.  Social History:  reports that she has been smoking cigarettes. She has been smoking about 1.00 pack per day. She uses smokeless tobacco. She reports current alcohol use. She reports that she does not use drugs.  Additional Social History:  Alcohol / Drug Use Pain Medications: See MAR Prescriptions: See MAR Over the Counter: See MAR History of alcohol / drug use?: No history of  alcohol / drug abuse(Per daughter, pt has a history of using pills. Pt's UDS is negative.)  CIWA: CIWA-Ar BP: 104/75 Pulse Rate: 100 COWS:    Allergies:  Allergies  Allergen Reactions  . Compazine [Prochlorperazine]   . Reglan [Metoclopramide]     Home Medications: (Not in a hospital admission)   OB/GYN Status:  No LMP recorded (lmp unknown).  General Assessment Data Location of Assessment: [facility] ED TTS Assessment: In system Is this a Tele or Face-to-Face Assessment?: Tele Assessment Is this an Initial Assessment or a Re-assessment for this encounter?: Initial Assessment Patient Accompanied by:: N/A Language Other than English: No Living Arrangements: Other (Comment)(Daughter, daughter's boyfriend and grandson. ) What gender do you identify as?: Female Marital status: Widowed Living Arrangements: Children, Other relatives Can pt return to current living arrangement?: Yes Admission Status: Voluntary Is patient capable of signing voluntary admission?: Yes Referral Source: Self/Family/Friend Insurance type: [place].     Crisis Care Plan Living Arrangements: Children, Other relatives Legal Guardian: Other:(Self. ) Name of Psychiatrist: NA Name of Therapist: NA  Education Status Is patient currently in school?: No Is the patient employed, unemployed or receiving disability?: Unemployed  Risk to self with the past 6 months Suicidal Ideation: No(Pt denies. ) Has patient been a risk to self within the past 6 months prior to admission? : No Suicidal Intent: No Has patient had any suicidal intent within the past 6 months prior to admission? : No Is patient at risk for suicide?: No Suicidal Plan?: No Has patient had any suicidal plan within the past 6 months prior to admission? : No Access to Means: No What has been your use of drugs/alcohol within the last 12 months?: UDS is negative.  Previous Attempts/Gestures: No How many times?: 0 Other Self Harm Risks: Pt took unknown  pill for a migraine, taking extra Klonpin for sleep.  Triggers for Past Attempts: None known Intentional Self Injurious Behavior: None(Pt denies. ) Family Suicide History: Yes(Aunt's grandson and aunt's son. ) Recent stressful life event(s): Other (Comment)(Fave aunt passed away a few days ago, worried about grandson) Persecutory voices/beliefs?: No Depression: Yes Depression Symptoms: Feeling worthless/self pity, Loss of interest in usual pleasures, Isolating, Fatigue, Guilt, Tearfulness, Insomnia, Despondent Substance abuse history and/or treatment for substance abuse?: Yes(Per daughter, pt has a history of using pills. ) Suicide prevention information given to non-admitted patients: Not applicable  Risk to Others within the past 6 months Homicidal Ideation: No(Pt denies. ) Does patient have any lifetime risk of violence toward others beyond the six months prior to admission? : No(Pt denies. ) Thoughts of Harm to Others: No Current Homicidal Intent: No Current Homicidal Plan: No Access to Homicidal Means: No Identified Victim: NA History of harm to others?: No Assessment of Violence: None Noted Violent Behavior Description: NA Does patient have access to weapons?: No Criminal Charges Pending?: No Does patient have a court date: No Is patient on probation?: No  Psychosis Hallucinations: None noted(Pt denies. ) Delusions: None noted  Mental Status Report Appearance/Hygiene: In hospital gown Eye Contact: Good Motor Activity: Unremarkable Speech: Logical/coherent Level of  Consciousness: Other (Comment)(tearful at times. ) Mood: Depressed, Anxious Affect: Other (Comment)(congruent with mood. ) Anxiety Level: Panic Attacks Panic attack frequency: Per pt with meds 1x per week. Without meds 3-5x per week.  Most recent panic attack: Per pt, "the other day."  Thought Processes: Coherent, Relevant Judgement: Partial Orientation: Person, Place, Time Obsessive Compulsive  Thoughts/Behaviors: None  Cognitive Functioning Concentration: Normal Memory: Recent Impaired Is patient IDD: No Insight: Poor Impulse Control: Poor Appetite: Poor Sleep: Decreased Total Hours of Sleep: 2 Vegetative Symptoms: None  ADLScreening [facility] Assessment Services) Patient's cognitive ability adequate to safely complete daily activities?: Yes Patient able to express need for assistance with ADLs?: Yes Independently performs ADLs?: Yes (appropriate for developmental age)  Prior Inpatient Therapy Prior Inpatient Therapy: Yes Prior Therapy Dates: Per pt, years ago.  Prior Therapy Facilty/Provider(s): [facility]  Reason for Treatment: Depression.   Prior Outpatient Therapy Prior Outpatient Therapy: No Does patient have an ACCT team?: No Does patient have Intensive In-House Services?  : No Does patient have [facility] services? : No Does patient have P4CC services?: No  ADL Screening (condition at time of admission) Patient's cognitive ability adequate to safely complete daily activities?: Yes Is the patient deaf or have difficulty hearing?: No Does the patient have difficulty seeing, even when wearing glasses/contacts?: No Does the patient have difficulty concentrating, remembering, or making decisions?: No Patient able to express need for assistance with ADLs?: Yes Does the patient have difficulty dressing or bathing?: No Independently performs ADLs?: Yes (appropriate for developmental age) Does the patient have difficulty walking or climbing stairs?: No Weakness of Legs: None Weakness of Arms/Hands: None  Home Assistive Devices/Equipment Home Assistive Devices/Equipment: Eyeglasses    Abuse/Neglect Assessment (Assessment to be complete while patient is alone) Abuse/Neglect Assessment Can Be Completed: Yes Physical Abuse: Denies Verbal Abuse: Yes, past (Comment) Sexual Abuse: Denies Exploitation of patient/patient's resources: Denies Self-Neglect: Denies      Merchant navy officer (For Healthcare) Does Patient Have a Medical Advance Directive?: No Would patient like information on creating a medical advance directive?: No - Patient declined          Disposition: Nira Conn, NP recommends pt to be observed and reassessed by psychiatry and be linked to Peer Support. Disposition discussed with Dr. Blinda Leatherwood and Lurena Joiner, RN.    Disposition Initial Assessment Completed for this Encounter: Yes  This service was provided via telemedicine using a 2-way, interactive audio and video technology.  Names of all persons participating in this telemedicine service and their role in this encounter. Name: Bryton Sinn. Role: Patient.  Name: Redmond Pulling, MS, [facility]. Role: Counselor.   Name: Crystal Campos (via phone.)  Role: Daughter.        Redmond Pulling 05/16/2020 5:14 AM    Redmond Pulling, MS, [facility] Triage Specialist [phone]

## 2020-05-17 ENCOUNTER — Encounter (HOSPITAL_COMMUNITY): Payer: Self-pay | Admitting: Internal Medicine

## 2020-05-17 DIAGNOSIS — I2510 Atherosclerotic heart disease of native coronary artery without angina pectoris: Secondary | ICD-10-CM | POA: Diagnosis present

## 2020-05-17 DIAGNOSIS — G5631 Lesion of radial nerve, right upper limb: Secondary | ICD-10-CM | POA: Diagnosis not present

## 2020-05-17 DIAGNOSIS — G5630 Lesion of radial nerve, unspecified upper limb: Secondary | ICD-10-CM | POA: Diagnosis present

## 2020-05-17 DIAGNOSIS — F17210 Nicotine dependence, cigarettes, uncomplicated: Secondary | ICD-10-CM | POA: Diagnosis present

## 2020-05-17 DIAGNOSIS — Z9081 Acquired absence of spleen: Secondary | ICD-10-CM | POA: Diagnosis not present

## 2020-05-17 DIAGNOSIS — R6510 Systemic inflammatory response syndrome (SIRS) of non-infectious origin without acute organ dysfunction: Secondary | ICD-10-CM | POA: Diagnosis present

## 2020-05-17 DIAGNOSIS — Z79899 Other long term (current) drug therapy: Secondary | ICD-10-CM | POA: Diagnosis not present

## 2020-05-17 DIAGNOSIS — G92 Toxic encephalopathy: Secondary | ICD-10-CM | POA: Diagnosis present

## 2020-05-17 DIAGNOSIS — N179 Acute kidney failure, unspecified: Secondary | ICD-10-CM | POA: Diagnosis present

## 2020-05-17 DIAGNOSIS — G9340 Encephalopathy, unspecified: Secondary | ICD-10-CM | POA: Diagnosis not present

## 2020-05-17 DIAGNOSIS — T424X1A Poisoning by benzodiazepines, accidental (unintentional), initial encounter: Secondary | ICD-10-CM | POA: Diagnosis present

## 2020-05-17 DIAGNOSIS — I700 Atherosclerosis of aorta: Secondary | ICD-10-CM | POA: Diagnosis present

## 2020-05-17 DIAGNOSIS — J9601 Acute respiratory failure with hypoxia: Secondary | ICD-10-CM | POA: Diagnosis present

## 2020-05-17 DIAGNOSIS — K219 Gastro-esophageal reflux disease without esophagitis: Secondary | ICD-10-CM | POA: Diagnosis present

## 2020-05-17 DIAGNOSIS — M4854XA Collapsed vertebra, not elsewhere classified, thoracic region, initial encounter for fracture: Secondary | ICD-10-CM | POA: Diagnosis present

## 2020-05-17 DIAGNOSIS — M21331 Wrist drop, right wrist: Secondary | ICD-10-CM | POA: Diagnosis present

## 2020-05-17 DIAGNOSIS — F329 Major depressive disorder, single episode, unspecified: Secondary | ICD-10-CM | POA: Diagnosis present

## 2020-05-17 DIAGNOSIS — J9602 Acute respiratory failure with hypercapnia: Secondary | ICD-10-CM | POA: Diagnosis present

## 2020-05-17 DIAGNOSIS — T50904A Poisoning by unspecified drugs, medicaments and biological substances, undetermined, initial encounter: Secondary | ICD-10-CM

## 2020-05-17 DIAGNOSIS — E876 Hypokalemia: Secondary | ICD-10-CM | POA: Diagnosis present

## 2020-05-17 DIAGNOSIS — R000 Tachycardia, unspecified: Secondary | ICD-10-CM | POA: Diagnosis present

## 2020-05-17 DIAGNOSIS — R739 Hyperglycemia, unspecified: Secondary | ICD-10-CM | POA: Diagnosis present

## 2020-05-17 DIAGNOSIS — N390 Urinary tract infection, site not specified: Secondary | ICD-10-CM | POA: Diagnosis present

## 2020-05-17 DIAGNOSIS — D72828 Other elevated white blood cell count: Secondary | ICD-10-CM | POA: Diagnosis present

## 2020-05-17 DIAGNOSIS — Z20822 Contact with and (suspected) exposure to covid-19: Secondary | ICD-10-CM | POA: Diagnosis present

## 2020-05-17 LAB — CBC WITH DIFFERENTIAL/PLATELET
Abs Immature Granulocytes: 0.14 10*3/uL — ABNORMAL HIGH (ref 0.00–0.07)
Basophils Absolute: 0.1 10*3/uL (ref 0.0–0.1)
Basophils Relative: 0 %
Eosinophils Absolute: 0 10*3/uL (ref 0.0–0.5)
Eosinophils Relative: 0 %
HCT: 35.4 % — ABNORMAL LOW (ref 36.0–46.0)
Hemoglobin: 11.3 g/dL — ABNORMAL LOW (ref 12.0–15.0)
Immature Granulocytes: 1 %
Lymphocytes Relative: 21 %
Lymphs Abs: 5.2 10*3/uL — ABNORMAL HIGH (ref 0.7–4.0)
MCH: 28.7 pg (ref 26.0–34.0)
MCHC: 31.9 g/dL (ref 30.0–36.0)
MCV: 89.8 fL (ref 80.0–100.0)
Monocytes Absolute: 1.3 10*3/uL — ABNORMAL HIGH (ref 0.1–1.0)
Monocytes Relative: 5 %
Neutro Abs: 17.7 10*3/uL — ABNORMAL HIGH (ref 1.7–7.7)
Neutrophils Relative %: 73 %
Platelets: 228 10*3/uL (ref 150–400)
RBC: 3.94 MIL/uL (ref 3.87–5.11)
RDW: 16.2 % — ABNORMAL HIGH (ref 11.5–15.5)
WBC: 24.5 10*3/uL — ABNORMAL HIGH (ref 4.0–10.5)
nRBC: 0 % (ref 0.0–0.2)

## 2020-05-17 LAB — CBC
HCT: 39.5 % (ref 36.0–46.0)
HCT: 35.8 % — ABNORMAL LOW (ref 36.0–46.0)
Hemoglobin: 12.8 g/dL (ref 12.0–15.0)
Hemoglobin: 11.3 g/dL — ABNORMAL LOW (ref 12.0–15.0)
MCH: 29 pg (ref 26.0–34.0)
MCH: 28.2 pg (ref 26.0–34.0)
MCHC: 32.4 g/dL (ref 30.0–36.0)
MCHC: 31.6 g/dL (ref 30.0–36.0)
MCV: 89.4 fL (ref 80.0–100.0)
MCV: 89.3 fL (ref 80.0–100.0)
Platelets: 285 10*3/uL (ref 150–400)
Platelets: 230 10*3/uL (ref 150–400)
RBC: 4.42 MIL/uL (ref 3.87–5.11)
RBC: 4.01 MIL/uL (ref 3.87–5.11)
RDW: 16.3 % — ABNORMAL HIGH (ref 11.5–15.5)
RDW: 16.2 % — ABNORMAL HIGH (ref 11.5–15.5)
WBC: 29 10*3/uL — ABNORMAL HIGH (ref 4.0–10.5)
WBC: 24.7 10*3/uL — ABNORMAL HIGH (ref 4.0–10.5)
nRBC: 0 % (ref 0.0–0.2)
nRBC: 0 % (ref 0.0–0.2)

## 2020-05-17 LAB — PROTIME-INR
INR: 1.1 (ref 0.8–1.2)
Prothrombin Time: 13.7 seconds (ref 11.4–15.2)

## 2020-05-17 LAB — COMPREHENSIVE METABOLIC PANEL
ALT: 24 U/L (ref 0–44)
AST: 33 U/L (ref 15–41)
Albumin: 4 g/dL (ref 3.5–5.0)
Alkaline Phosphatase: 82 U/L (ref 38–126)
Anion gap: 10 (ref 5–15)
BUN: 15 mg/dL (ref 6–20)
CO2: 26 mmol/L (ref 22–32)
Calcium: 7.9 mg/dL — ABNORMAL LOW (ref 8.9–10.3)
Chloride: 103 mmol/L (ref 98–111)
Creatinine, Ser: 0.64 mg/dL (ref 0.44–1.00)
GFR calc Af Amer: >60 mL/min (ref >60)
GFR calc non Af Amer: >60 mL/min (ref >60)
Glucose, Bld: 93 mg/dL (ref 70–99)
Potassium: 3.9 mmol/L (ref 3.5–5.1)
Sodium: 139 mmol/L (ref 135–145)
Total Bilirubin: 0.7 mg/dL (ref 0.3–1.2)
Total Protein: 7 g/dL (ref 6.5–8.1)

## 2020-05-17 LAB — URINALYSIS, ROUTINE W REFLEX MICROSCOPIC
Bilirubin Urine: NEGATIVE
Glucose, UA: >=500 mg/dL — AB
Hgb urine dipstick: NEGATIVE
Ketones, ur: NEGATIVE mg/dL
Nitrite: NEGATIVE
Protein, ur: NEGATIVE mg/dL
Specific Gravity, Urine: 1.014 (ref 1.005–1.030)
pH: 6 (ref 5.0–8.0)

## 2020-05-17 LAB — COMPREHENSIVE METABOLIC PANEL WITH GFR
ALT: 28 U/L (ref 0–44)
AST: 44 U/L — ABNORMAL HIGH (ref 15–41)
Albumin: 4.4 g/dL (ref 3.5–5.0)
Alkaline Phosphatase: 104 U/L (ref 38–126)
Anion gap: 13 (ref 5–15)
BUN: 18 mg/dL (ref 6–20)
CO2: 29 mmol/L (ref 22–32)
Calcium: 8.8 mg/dL — ABNORMAL LOW (ref 8.9–10.3)
Chloride: 97 mmol/L — ABNORMAL LOW (ref 98–111)
Creatinine, Ser: 1.14 mg/dL — ABNORMAL HIGH (ref 0.44–1.00)
GFR calc Af Amer: >60 mL/min (ref >60)
GFR calc non Af Amer: 56 mL/min — ABNORMAL LOW (ref >60)
Glucose, Bld: 290 mg/dL — ABNORMAL HIGH (ref 70–99)
Potassium: 4.2 mmol/L (ref 3.5–5.1)
Sodium: 139 mmol/L (ref 135–145)
Total Bilirubin: 0.5 mg/dL (ref 0.3–1.2)
Total Protein: 8.2 g/dL — ABNORMAL HIGH (ref 6.5–8.1)

## 2020-05-17 LAB — HIV ANTIBODY (ROUTINE TESTING W REFLEX): HIV Screen 4th Generation wRfx: NONREACTIVE

## 2020-05-17 LAB — BLOOD GAS, VENOUS
Acid-Base Excess: 0.5 mmol/L (ref 0.0–2.0)
Bicarbonate: 28.7 mmol/L — ABNORMAL HIGH (ref 20.0–28.0)
O2 Saturation: 50.1 %
Patient temperature: 98.6
pCO2, Ven: 69.1 mmHg — ABNORMAL HIGH (ref 44.0–60.0)
pH, Ven: 7.242 — ABNORMAL LOW (ref 7.250–7.430)

## 2020-05-17 LAB — T4, FREE: Free T4: 0.8 ng/dL (ref 0.61–1.12)

## 2020-05-17 LAB — SARS CORONAVIRUS 2 BY RT PCR (HOSPITAL ORDER, PERFORMED IN ~~LOC~~ HOSPITAL LAB): SARS Coronavirus 2: NEGATIVE

## 2020-05-17 LAB — TSH: TSH: 6.151 u[IU]/mL — ABNORMAL HIGH (ref 0.350–4.500)

## 2020-05-17 LAB — LACTIC ACID, PLASMA
Lactic Acid, Venous: 1.2 mmol/L (ref 0.5–1.9)
Lactic Acid, Venous: 0.8 mmol/L (ref 0.5–1.9)

## 2020-05-17 LAB — PROCALCITONIN: Procalcitonin: 0.17 ng/mL

## 2020-05-17 LAB — VITAMIN B12: Vitamin B-12: 365 pg/mL (ref 180–914)

## 2020-05-17 LAB — RAPID URINE DRUG SCREEN, HOSP PERFORMED
Amphetamines: NOT DETECTED
Barbiturates: NOT DETECTED
Benzodiazepines: POSITIVE — AB
Cocaine: NOT DETECTED
Opiates: NOT DETECTED
Tetrahydrocannabinol: NOT DETECTED

## 2020-05-17 LAB — ETHANOL: Alcohol, Ethyl (B): <10 mg/dL (ref <10)

## 2020-05-17 LAB — APTT: aPTT: 28 seconds (ref 24–36)

## 2020-05-17 MED ORDER — SODIUM CHLORIDE 0.9 % IV SOLN
3.0000 g | Freq: Four times a day (QID) | INTRAVENOUS | Status: DC
  Administered 2020-05-17 – 2020-05-18 (×5): 3 g via INTRAVENOUS
  Filled 2020-05-17: qty 3
  Filled 2020-05-17: qty 8
  Filled 2020-05-17 (×2): qty 3
  Filled 2020-05-17: qty 8
  Filled 2020-05-17: qty 3

## 2020-05-17 MED ORDER — ACETAMINOPHEN 325 MG PO TABS: 650.0000 mg | ORAL_TABLET | Freq: Four times a day (QID) | ORAL | Status: DC | PRN

## 2020-05-17 MED ORDER — SODIUM CHLORIDE 0.9 % IV SOLN
3.0000 g | Freq: Once | INTRAVENOUS | Status: AC
  Administered 2020-05-17: 3 g via INTRAVENOUS
  Filled 2020-05-17: qty 8

## 2020-05-17 MED ORDER — SODIUM CHLORIDE 0.9 % IV BOLUS
1000.0000 mL | Freq: Once | INTRAVENOUS | Status: AC
  Administered 2020-05-17: 1000 mL via INTRAVENOUS

## 2020-05-17 MED ORDER — POLYETHYLENE GLYCOL 3350 17 G PO PACK: 17.0000 g | PACK | Freq: Every day | ORAL | Status: DC | PRN

## 2020-05-17 MED ORDER — PIPERACILLIN-TAZOBACTAM 3.375 G IVPB 30 MIN: 3.3750 g | Freq: Once | INTRAVENOUS | Status: DC

## 2020-05-17 MED ORDER — VANCOMYCIN HCL IN DEXTROSE 1-5 GM/200ML-% IV SOLN: 1000.0000 mg | Freq: Once | INTRAVENOUS | Status: DC

## 2020-05-17 MED ORDER — GABAPENTIN 100 MG PO CAPS
100.0000 mg | ORAL_CAPSULE | Freq: Three times a day (TID) | ORAL | Status: DC
  Administered 2020-05-17 – 2020-05-19 (×7): 100 mg via ORAL
  Filled 2020-05-17 (×7): qty 1

## 2020-05-17 MED ORDER — ACETAMINOPHEN 650 MG RE SUPP: 650.0000 mg | Freq: Four times a day (QID) | RECTAL | Status: DC | PRN

## 2020-05-17 MED ORDER — SODIUM CHLORIDE 0.9 % IV SOLN: INTRAVENOUS | Status: AC

## 2020-05-17 MED ORDER — HEPARIN SODIUM (PORCINE) 5000 UNIT/ML IJ SOLN
5000.0000 [IU] | Freq: Three times a day (TID) | INTRAMUSCULAR | Status: DC
  Administered 2020-05-17 – 2020-05-19 (×7): 5000 [IU] via SUBCUTANEOUS
  Filled 2020-05-17 (×6): qty 1

## 2020-05-17 MED ORDER — IPRATROPIUM BROMIDE 0.02 % IN SOLN: 0.5000 mg | Freq: Four times a day (QID) | RESPIRATORY_TRACT | Status: DC | PRN

## 2020-05-17 NOTE — Progress Notes (Signed)
pMN admission for OD on unknown substance. See H&P for full details. Continuing abx for presumed aspiration. Her resp status has improved since admission. She states she doesn't know how the passed out or why yellow powder was found around her. She admits that the only yellow pill she has is klonopin. She has been seen by [facility] for substance abuse. Appreciate their assistance. She is afebrile and vitals are ok. Continue abx for now. WBC coming down. TSH is elevated. FT4/3 ordered. Follow. Otherwise, continue per H&P from this morning.   Status is: Inpatient  Remains inpatient appropriate because:IV treatments appropriate due to intensity of illness or inability to take PO   Dispo: The patient is from: Home              Anticipated d/c is to: Undetermined at this time.              Anticipated d/c date is: 2 days              Patient currently is not medically stable to d/c.   General: 50 y.o. female resting in bed in NAD Cardiovascular: RRR, +S1, S2, no m/g/r, equal pulses throughout Respiratory: CTABL, no w/r/r, normal WOB, on [place] GI: BS+, NDNT, no masses noted, no organomegaly noted MSK: No e/c/c Neuro: A&O x 3, no focal deficits  Teddy Spike, DO

## 2020-05-17 NOTE — TOC Initial Note (Addendum)
Transition of Care [facility]) - Initial/Assessment Note    Patient Details  Name: Crystal Campos MRN: [id number] Date of Birth: 1971/11/10  [facility]) CM/SW Contact:    Ida Rogue, LCSW Phone Number: 05/17/2020, 9:33 AM  Clinical Narrative:   Patient seen in follow up to MD consult for substance abuse.  When asked why she was here, Crystal Campos responds that she feels nauseous in "reaction to something."  Denies knowing what that something is.  Reports no concerns nor problems other than her current medical status, and depression, which she hopes will be addressed through the [facility] IOP program, with whom she has an appointment tomorrow.  Crystal Campos moved from near [place] in December, where she lived with her mother, to [place] to live with daughter in order to help care for 66 YO grandson while daughter works.  She gave me permission to call her daughter.  Attempted to call.  Unable to leave a message. Will try back later. TOC will continue to follow during the course of hospitalization.  Addendum:  Spoke to daughter.  Explained her mother's extent of denial, and educated her that she is the only one who holds any leverage here.  She will come to see her mother today and talk to her about her concerns and desire for her to get help with substance abuse.  Addendum II:  Called [facility] IOP program to let them know patient is in hospital and will not make 10AM appointment tomorrow                Expected Discharge Plan: Home/Self Care Barriers to Discharge: No Barriers Identified   Patient Goals and CMS Choice        Expected Discharge Plan and Services Expected Discharge Plan: Home/Self Care In-house Referral: Clinical Social Work     Living arrangements for the past 2 months: Apartment                                      Prior Living Arrangements/Services Living arrangements for the past 2 months: Apartment Lives with:: Adult Children Patient language and  need for interpreter reviewed:: Yes Do you feel safe going back to the place where you live?: Yes      Need for Family Participation in Patient Care: Yes (Comment) Care giver support system in place?: Yes (comment)   Criminal Activity/Legal Involvement Pertinent to Current Situation/Hospitalization: No - Comment as needed  Activities of Daily Living Home Assistive Devices/Equipment: Eyeglasses ADL Screening (condition at time of admission) Patient's cognitive ability adequate to safely complete daily activities?: Yes Is the patient deaf or have difficulty hearing?: No Does the patient have difficulty seeing, even when wearing glasses/contacts?: No Does the patient have difficulty concentrating, remembering, or making decisions?: No Patient able to express need for assistance with ADLs?: Yes Does the patient have difficulty dressing or bathing?: No Independently performs ADLs?: Yes (appropriate for developmental age) Does the patient have difficulty walking or climbing stairs?: No Weakness of Legs: None Weakness of Arms/Hands: None  Permission Sought/Granted Permission sought to share information with : Family Supports Permission granted to share information with : Yes, Verbal Permission Granted  Share Information with NAME: [name]     Permission granted to share info w Relationship: daughter  Permission granted to share info w Contact Information: [phone]  Emotional Assessment Appearance:: Appears stated age Attitude/Demeanor/Rapport: Engaged Affect (typically  observed): Constricted Orientation: : Oriented to Self, Oriented to Place, Oriented to  Time, Oriented to Situation Alcohol / Substance Use: Other (comment)(concerns for substance abuse) Psych Involvement: No (comment)  Admission diagnosis:  Tachycardia [R00.0] SIRS (systemic inflammatory response syndrome) (HCC) [R65.10] Right wrist drop [M21.331] Benzodiazepine overdose, accidental or unintentional, initial  encounter [T42.4X1A] Leukocytosis, unspecified type [D72.829] Aspiration pneumonia of right upper lobe, unspecified aspiration pneumonia type [facility]) [J69.0] Patient Active Problem List   Diagnosis Date Noted  . SIRS (systemic inflammatory response syndrome) ([place]) 05/17/2020  . 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 Acute lower UTI 05/17/2020  . GAD (generalized anxiety disorder) 05/16/2020   PCP:  System, Pcp Not In Pharmacy:   [id number] [place], [address] [place] [address] Phone: [phone] Fax: [phone]     Social Determinants of Health (SDOH) Interventions    Readmission Risk Interventions No flowsheet data found.

## 2020-05-17 NOTE — ED Notes (Signed)
Blood cultures attempted x2 without success. Antibiotics started

## 2020-05-17 NOTE — Evaluation (Signed)
Physical Therapy Evaluation Patient Details Name: Crystal Campos MRN: [id number] DOB: Jun 27, 1971 Today's Date: 05/17/2020   History of Present Illness  Patient is 49 y.o. female with PMH significant for depression, GERD and history of substance abuse. Patient was found unresponsive at home and present to [facility] via EMS and admitted for suspected drug overdose. Pt was previously seen in ED for similar event on 05/15/20 and was discharged home early in the morning on 5/17. After returning home pt reports taking medications to hlp sleep. no memory following this. Pt's daughter reports found her unconscious that evening after returning home from work and she called 911 and initaite CPR. Pt's daughter reports similar episode ~ 68months ago in [place], [place] prior to pt moving in with her daughter here in [place] to help care for her grandson.     Clinical Impression  Crystal Campos is 49 y.o. female admitted with above HPI and diagnosis. Patient is currently limited by functional impairments below (see PT problem list). Patient lives with her daughter and grandson and is independent at baseline. Patient is functioning close to baseline and currently is requiring supervision for gait and functional mobility. Recommend pt mobilize daily with RN staff at this time, acute PT will sign off as she does not require skilled acute PT services. Please re-consult if there is a change in functional status.     Follow Up Recommendations No PT follow up    Equipment Recommendations  None recommended by PT    Recommendations for Other Services       Precautions / Restrictions Precautions Precautions: Fall Restrictions Weight Bearing Restrictions: No      Mobility  Bed Mobility Overal bed mobility: Modified Independent Bed Mobility: Supine to Sit;Sit to Supine     Supine to sit: Modified independent (Device/Increase time) Sit to supine: Modified independent (Device/Increase time)   General bed mobility  comments: no cues or assist required. some extra time.  Transfers Overall transfer level: Needs assistance Equipment used: None Transfers: Sit to/from Stand Sit to Stand: Supervision         General transfer comment: cupervision for safety, no assist requried to rise or steady  Ambulation/Gait Ambulation/Gait assistance: Supervision Gait Distance (Feet): 110 Feet Assistive device: None Gait Pattern/deviations: Step-through pattern;Decreased stride length Gait velocity: fair   General Gait Details: no overt LOB noted, pt with mild sway but overall steady. pt noted to desat to 88% on RA with gait and came back up to 91% during gait with cues for breathing.   Stairs            Wheelchair Mobility    Modified Rankin (Stroke Patients Only)       Balance Overall balance assessment: Mild deficits observed, not formally tested                   Pertinent Vitals/Pain Pain Assessment: Faces Faces Pain Scale: Hurts a little bit Pain Location: chest/ribs, and Rt wrist Pain Descriptors / Indicators: Discomfort Pain Intervention(s): Limited activity within patient's tolerance;Repositioned;Monitored during session    Home Living Family/patient expects to be discharged to:: Private residence Living Arrangements: Children;Other relatives;Non-relatives/Friends(pt's daughter and her boyfriend, and pt's grandson) Available Help at Discharge: Family Type of Home: Apartment Home Access: Stairs to enter Entrance Stairs-Rails: Can reach both Entrance Stairs-Number of Steps: 10-12 Home Layout: One level Home Equipment: None      Prior Function Level of Independence: Independent         Comments: pt reports she stays at  home and takes care of her 65 y.o. grandson     Hand Dominance   Dominant Hand: Right    Extremity/Trunk Assessment   Upper Extremity Assessment Upper Extremity Assessment: Overall WFL for tasks assessed;RUE deficits/detail RUE Deficits /  Details: Rt wrist splint in place.     Lower Extremity Assessment Lower Extremity Assessment: Overall WFL for tasks assessed    Cervical / Trunk Assessment Cervical / Trunk Assessment: Normal  Communication   Communication: No difficulties  Cognition Arousal/Alertness: Awake/alert Behavior During Therapy: WFL for tasks assessed/performed Overall Cognitive Status: Within Functional Limits for tasks assessed                 General Comments General comments (skin integrity, edema, etc.): pt on 2L/min at start of session and resting at 95% SpO2. on RA pt decreased to 91% and dropped to 88% with gait. After gait and with seated break improved to 96% on RA momentarily and then decreased to 90%. O2 reapplied at EOS and RN notified.     Exercises     Assessment/Plan    PT Assessment Patent does not need any further PT services  PT Problem List         PT Treatment Interventions      PT Goals (Current goals can be found in the Care Plan section)  Acute Rehab PT Goals Patient Stated Goal: return home PT Goal Formulation: All assessment and education complete, DC therapy Time For Goal Achievement: 05/31/20 Potential to Achieve Goals: Good    Frequency  1x eval/treat    AM-PAC PT "6 Clicks" Mobility  Outcome Measure Help needed turning from your back to your side while in a flat bed without using bedrails?: None Help needed moving from lying on your back to sitting on the side of a flat bed without using bedrails?: None Help needed moving to and from a bed to a chair (including a wheelchair)?: None Help needed standing up from a chair using your arms (e.g., wheelchair or bedside chair)?: None Help needed to walk in hospital room?: A Little Help needed climbing 3-5 steps with a railing? : A Little 6 Click Score: 22    End of Session Equipment Utilized During Treatment: Gait belt Activity Tolerance: Patient tolerated treatment well Patient left: in bed;with call  bell/phone within reach;with bed alarm set   PT Visit Diagnosis: Unsteadiness on feet (R26.81);Difficulty in walking, not elsewhere classified (R26.2)    Time: 1110-1135 PT Time Calculation (min) (ACUTE ONLY): 25 min   Charges:   PT Evaluation $PT Eval Low Complexity: 1 Low PT Treatments $Gait Training: 8-22 mins        Verner Mould, DPT Physical Therapist with [facility] [phone]  05/17/2020 1:06 PM
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
## 2020-05-17 NOTE — Progress Notes (Signed)
Orthopedic Tech Progress Note Patient Details:  Crystal Campos [birth year] [id number] Charting from night shift. Patient ID: [name], female   DOB: 12/25/1971, 49 y.o.   MRN: [id number]   Ancil Linsey 05/17/2020, 10:12 AM

## 2020-05-17 NOTE — ED Notes (Signed)
Urine and culture sent to lab  

## 2020-05-18 ENCOUNTER — Ambulatory Visit (HOSPITAL_COMMUNITY): Payer: Self-pay

## 2020-05-18 DIAGNOSIS — G5631 Lesion of radial nerve, right upper limb: Secondary | ICD-10-CM

## 2020-05-18 DIAGNOSIS — R6510 Systemic inflammatory response syndrome (SIRS) of non-infectious origin without acute organ dysfunction: Secondary | ICD-10-CM

## 2020-05-18 DIAGNOSIS — G9340 Encephalopathy, unspecified: Secondary | ICD-10-CM

## 2020-05-18 LAB — CBC WITH DIFFERENTIAL/PLATELET
Abs Immature Granulocytes: 0.05 10*3/uL (ref 0.00–0.07)
Basophils Absolute: 0.2 10*3/uL — ABNORMAL HIGH (ref 0.0–0.1)
Basophils Relative: 1 %
Eosinophils Absolute: 0.5 10*3/uL (ref 0.0–0.5)
Eosinophils Relative: 3 %
HCT: 37.2 % (ref 36.0–46.0)
Hemoglobin: 12.1 g/dL (ref 12.0–15.0)
Immature Granulocytes: 0 %
Lymphocytes Relative: 47 %
Lymphs Abs: 8.7 10*3/uL — ABNORMAL HIGH (ref 0.7–4.0)
MCH: 29.1 pg (ref 26.0–34.0)
MCHC: 32.5 g/dL (ref 30.0–36.0)
MCV: 89.4 fL (ref 80.0–100.0)
Monocytes Absolute: 1.4 10*3/uL — ABNORMAL HIGH (ref 0.1–1.0)
Monocytes Relative: 7 %
Neutro Abs: 7.7 10*3/uL (ref 1.7–7.7)
Neutrophils Relative %: 42 %
Platelets: 252 10*3/uL (ref 150–400)
RBC: 4.16 MIL/uL (ref 3.87–5.11)
RDW: 16.1 % — ABNORMAL HIGH (ref 11.5–15.5)
WBC: 18.4 10*3/uL — ABNORMAL HIGH (ref 4.0–10.5)
nRBC: 0 % (ref 0.0–0.2)

## 2020-05-18 LAB — COMPREHENSIVE METABOLIC PANEL
ALT: 20 U/L (ref 0–44)
AST: 27 U/L (ref 15–41)
Albumin: 3.5 g/dL (ref 3.5–5.0)
Alkaline Phosphatase: 74 U/L (ref 38–126)
Anion gap: 10 (ref 5–15)
BUN: 6 mg/dL (ref 6–20)
CO2: 26 mmol/L (ref 22–32)
Calcium: 8.6 mg/dL — ABNORMAL LOW (ref 8.9–10.3)
Chloride: 104 mmol/L (ref 98–111)
Creatinine, Ser: 0.56 mg/dL (ref 0.44–1.00)
GFR calc Af Amer: >60 mL/min (ref >60)
GFR calc non Af Amer: >60 mL/min (ref >60)
Glucose, Bld: 107 mg/dL — ABNORMAL HIGH (ref 70–99)
Potassium: 3.4 mmol/L — ABNORMAL LOW (ref 3.5–5.1)
Sodium: 140 mmol/L (ref 135–145)
Total Bilirubin: 0.7 mg/dL (ref 0.3–1.2)
Total Protein: 6.9 g/dL (ref 6.5–8.1)

## 2020-05-18 LAB — HEMOGLOBIN A1C
Hgb A1c MFr Bld: 5.6 % (ref 4.8–5.6)
Mean Plasma Glucose: 114 mg/dL

## 2020-05-18 LAB — PATHOLOGIST SMEAR REVIEW

## 2020-05-18 LAB — T3, FREE: T3, Free: 3.2 pg/mL (ref 2.0–4.4)

## 2020-05-18 LAB — PROCALCITONIN: Procalcitonin: 0.29 ng/mL

## 2020-05-18 MED ORDER — NICOTINE 14 MG/24HR TD PT24
14.0000 mg | MEDICATED_PATCH | Freq: Every day | TRANSDERMAL | Status: DC
  Administered 2020-05-18 – 2020-05-19 (×2): 14 mg via TRANSDERMAL
  Filled 2020-05-18 (×2): qty 1

## 2020-05-18 MED ORDER — POTASSIUM CHLORIDE CRYS ER 20 MEQ PO TBCR
40.0000 meq | EXTENDED_RELEASE_TABLET | Freq: Two times a day (BID) | ORAL | Status: AC
  Administered 2020-05-18 (×2): 40 meq via ORAL
  Filled 2020-05-18 (×2): qty 2

## 2020-05-18 MED ORDER — SODIUM CHLORIDE 0.9 % IV SOLN: INTRAVENOUS | Status: DC

## 2020-05-18 MED ORDER — BUPRENORPHINE HCL-NALOXONE HCL 8-2 MG SL SUBL
1.0000 | SUBLINGUAL_TABLET | Freq: Two times a day (BID) | SUBLINGUAL | Status: DC
  Administered 2020-05-18 – 2020-05-19 (×2): 1 via SUBLINGUAL
  Filled 2020-05-18 (×3): qty 1
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
## 2020-05-18 NOTE — TOC Progression Note (Signed)
Transition of Care [facility]) - Progression Note    Patient Details  Name: Crystal Campos MRN: [id number] Date of Birth: 1971-10-17  Transition of Care [facility] CM/SW Contact  Ida Rogue, [place] Phone Number: 05/18/2020, 11:58 AM  Clinical Narrative:   Called daughter in an attempt to circle back around and find out if she needed any help from me.  No answer.  Unable to leave amessage.  Will try back later as time allows. TOC will continue to follow during the course of hospitalization.     Expected Discharge Plan: Home/Self Care Barriers to Discharge: No Barriers Identified  Expected Discharge Plan and Services Expected Discharge Plan: Home/Self Care In-house Referral: Clinical Social Work     Living arrangements for the past 2 months: Apartment                                       Social Determinants of Health (SDOH) Interventions    Readmission Risk Interventions No flowsheet data found.

## 2020-05-19 LAB — COMPREHENSIVE METABOLIC PANEL
ALT: 21 U/L (ref 0–44)
AST: 26 U/L (ref 15–41)
Albumin: 3.8 g/dL (ref 3.5–5.0)
Alkaline Phosphatase: 75 U/L (ref 38–126)
Anion gap: 9 (ref 5–15)
BUN: <5 mg/dL — ABNORMAL LOW (ref 6–20)
CO2: 26 mmol/L (ref 22–32)
Calcium: 9.1 mg/dL (ref 8.9–10.3)
Chloride: 103 mmol/L (ref 98–111)
Creatinine, Ser: 0.5 mg/dL (ref 0.44–1.00)
GFR calc Af Amer: >60 mL/min (ref >60)
GFR calc non Af Amer: >60 mL/min (ref >60)
Glucose, Bld: 109 mg/dL — ABNORMAL HIGH (ref 70–99)
Potassium: 3.7 mmol/L (ref 3.5–5.1)
Sodium: 138 mmol/L (ref 135–145)
Total Bilirubin: 0.7 mg/dL (ref 0.3–1.2)
Total Protein: 7.5 g/dL (ref 6.5–8.1)

## 2020-05-19 LAB — CBC WITH DIFFERENTIAL/PLATELET
Abs Immature Granulocytes: 0.06 10*3/uL (ref 0.00–0.07)
Basophils Absolute: 0.2 10*3/uL — ABNORMAL HIGH (ref 0.0–0.1)
Basophils Relative: 1 %
Eosinophils Absolute: 0.4 10*3/uL (ref 0.0–0.5)
Eosinophils Relative: 2 %
HCT: 38.7 % (ref 36.0–46.0)
Hemoglobin: 12.8 g/dL (ref 12.0–15.0)
Immature Granulocytes: 0 %
Lymphocytes Relative: 49 %
Lymphs Abs: 9.9 10*3/uL — ABNORMAL HIGH (ref 0.7–4.0)
MCH: 29.1 pg (ref 26.0–34.0)
MCHC: 33.1 g/dL (ref 30.0–36.0)
MCV: 88 fL (ref 80.0–100.0)
Monocytes Absolute: 1.6 10*3/uL — ABNORMAL HIGH (ref 0.1–1.0)
Monocytes Relative: 8 %
Neutro Abs: 8 10*3/uL — ABNORMAL HIGH (ref 1.7–7.7)
Neutrophils Relative %: 40 %
Platelets: 270 10*3/uL (ref 150–400)
RBC: 4.4 MIL/uL (ref 3.87–5.11)
RDW: 15.9 % — ABNORMAL HIGH (ref 11.5–15.5)
WBC: 20.1 10*3/uL — ABNORMAL HIGH (ref 4.0–10.5)
nRBC: 0 % (ref 0.0–0.2)

## 2020-05-19 LAB — PHOSPHORUS: Phosphorus: 3 mg/dL (ref 2.5–4.6)

## 2020-05-19 LAB — MAGNESIUM: Magnesium: 1.7 mg/dL (ref 1.7–2.4)

## 2020-05-19 LAB — PROCALCITONIN: Procalcitonin: 0.15 ng/mL

## 2020-05-19 MED ORDER — NICOTINE 14 MG/24HR TD PT24: 14.0000 mg | MEDICATED_PATCH | Freq: Every day | TRANSDERMAL | 0 refills | Status: DC

## 2020-05-19 MED ORDER — POLYETHYLENE GLYCOL 3350 17 G PO PACK: 17.0000 g | PACK | Freq: Every day | ORAL | 0 refills | Status: DC | PRN

## 2020-05-19 MED ORDER — MAGNESIUM SULFATE 2 GM/50ML IV SOLN
2.0000 g | Freq: Once | INTRAVENOUS | Status: AC
  Administered 2020-05-19: 2 g via INTRAVENOUS
  Filled 2020-05-19: qty 50

## 2020-05-19 NOTE — Plan of Care (Signed)
  Problem: Clinical Measurements: Goal: Ability to maintain clinical measurements within normal limits will improve Outcome: Adequate for Discharge Goal: Will remain free from infection Outcome: Adequate for Discharge Goal: Diagnostic test results will improve Outcome: Adequate for Discharge Goal: Respiratory complications will improve Outcome: Adequate for Discharge Goal: Cardiovascular complication will be avoided Outcome: Adequate for Discharge   Problem: Education: Goal: Knowledge of General Education information will improve Description: Including pain rating scale, medication(s)/side effects and non-pharmacologic comfort measures Outcome: Adequate for Discharge   Problem: Health Behavior/Discharge Planning: Goal: Ability to manage health-related needs will improve Outcome: Adequate for Discharge   Problem: Clinical Measurements: Goal: Ability to maintain clinical measurements within normal limits will improve Outcome: Adequate for Discharge Goal: Will remain free from infection Outcome: Adequate for Discharge Goal: Diagnostic test results will improve Outcome: Adequate for Discharge Goal: Respiratory complications will improve Outcome: Adequate for Discharge Goal: Cardiovascular complication will be avoided Outcome: Adequate for Discharge   Problem: Activity: Goal: Risk for activity intolerance will decrease Outcome: Adequate for Discharge   Problem: Nutrition: Goal: Adequate nutrition will be maintained Outcome: Adequate for Discharge   Problem: Coping: Goal: Level of anxiety will decrease Outcome: Adequate for Discharge   Problem: Elimination: Goal: Will not experience complications related to bowel motility Outcome: Adequate for Discharge Goal: Will not experience complications related to urinary retention Outcome: Adequate for Discharge   Problem: Pain Managment: Goal: General experience of comfort will improve Outcome: Adequate for Discharge    Problem: Safety: Goal: Ability to remain free from injury will improve Outcome: Adequate for Discharge   Problem: Skin Integrity: Goal: Risk for impaired skin integrity will decrease Outcome: Adequate for Discharge   
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
## 2020-05-19 NOTE — Progress Notes (Signed)
Discharge instructions given with stated understanding.  Patient waiting for transportation home at this time 

## 2020-05-19 NOTE — Discharge Instructions (Signed)
Accidental Drug Poisoning, Adult Accidental drug poisoning happens when a person accidentally takes too much of a substance, such as a prescription medicine, an over-the-counter medicine, a vitamin, a supplement, or an illegal drug. The effects of drug poisoning can be mild, dangerous, or even deadly. What are the causes? This condition is caused by taking too much of a medicine, illegal drug, or other substance. It often results from:  Lack of knowledge about a substance.  Using more than one substance at the same time.  An error made by the health care provider who prescribed the substance.  An error made by the pharmacist who filled the prescription.  A lapse in memory, such as forgetting that you have already taken a dose of the medicine.  Suddenly using a substance after a long period of not using it. The following substances and medicines are more likely to cause an accidental drug poisoning:  Medicines that treat mental problems (psychotropic medicines).  Pain medicines.  Cocaine.  Heroin.  Multivitamins that contain iron.  Over-the-counter cold and cough medicines. What increases the risk? This condition is more likely to occur in:  Elderly adults. Elderly adults are at risk because they may: ? Be taking many different medicines. ? Have difficulty reading labels. ? Forget when they last took their medicine.  People who use illegal drugs.  People who drink alcohol while using illegal drugs or certain medicines.  People with certain mental health conditions. What are the signs or symptoms? Symptoms of this condition depend on the substance and the amount that was taken. Common symptoms include:  Behavior changes, such as confusion.  Sleepiness.  Weakness.  Slowed breathing.  Nausea and vomiting.  Seizures.  Very large or small eye pupil size. A drug poisoning can cause a very serious condition in which your blood pressure drops to a low level (shock).  Symptoms of shock include:  Cold and clammy skin.  Pale skin.  Blue lips.  Very slow breathing.  Extreme sleepiness.  Severe confusion.  Dizziness or fainting. How is this diagnosed? This condition is diagnosed based on:  Your symptoms. You will be asked about the substances you took and when you took them.  A physical exam. You may also have other tests, including:  Urine tests.  Blood tests.  An electrocardiogram (ECG). How is this treated? This condition may need to be treated right away at the hospital. Treatment may involve:  Getting fluids and electrolytes through an IV.  Having a breathing tube inserted in your airway (endotracheal tube) to help you breathe.  Taking medicines. These may include medicines that: ? Absorb any substance that is in your digestive system. ? Block or reverse the effect of the substance that caused the drug poisoning.  Having your blood filtered through an artificial kidney machine (hemodialysis).  Ongoing counseling and mental health support. This may be provided if you used an illegal drug. Follow these instructions at home: Medicines   Take over-the-counter and prescription medicines only as told by your health care provider.  Before taking a new medicine, ask your health care provider whether the medicine: ? May cause side effects. ? Might react with other medicines.  Keep a list of all the medicines that you take, including over-the-counter medicines, vitamins, supplements, and herbs. Bring this list with you to all of your medical visits. General instructions   Drink enough fluid to keep your urine pale yellow.  If you are working with a counselor or mental health professional, make   sure to follow his or her instructions.  Do not drink alcohol if: ? Your health care provider tells you not to drink. ? You are pregnant, may be pregnant, or are planning to become pregnant.  If you drink alcohol, limit how much you  have: ? 0-1 drink a day for women. ? 0-2 drinks a day for men.  Be aware of how much alcohol is in your drink. In the [place] one drink equals one typical bottle of beer (12 oz), one-half glass of wine (5 oz), or one shot of hard liquor (1 oz).  Keep all follow-up visits as told by your health care provider. This is important. How is this prevented?   Get help if you are struggling with: ? Alcohol or drug use. ? Depression or another mental health problem.  Keep the phone number of your local poison control center near your phone or on your cell phone. The hotline of the American Association of Poison Control Centers is (800) 222-1222.  Store all medicines in safety containers that are out of the reach of children.  Read the drug inserts that come with your medicines.  Create a system for taking your medicine, such as a pillbox, that will help you avoid taking too much of the medicine.  Do not drink alcohol while taking medicines unless your health care provider approves.  Do not use illegal drugs.  Do not take medicines that are not prescribed for you. Contact a health care provider if:  Your symptoms return.  You develop new symptoms or side effects after taking a medicine.  You have questions about possible drug poisoning. Call your local poison control center at (800) 222-1222. Get help right away if:  You think that you or someone else may have taken too much of a substance.  You or someone else is having symptoms of drug poisoning. Summary  Accidental drug poisoning happens when a person accidentally takes too much of a substance, such as a prescription medicine, an over-the-counter medicine, a vitamin, a supplement, or an illegal drug.  The effects of drug poisoning can be mild, dangerous, or even deadly.  This condition is diagnosed based on your symptoms and a physical exam. You will be asked to tell your health care provider which substances you took and when you  took them.  This condition may need to be treated right away at the hospital. This information is not intended to replace advice given to you by your health care provider. Make sure you discuss any questions you have with your health care provider. Document Revised: 11/29/2017 Document Reviewed: 11/18/2017 Elsevier Patient Education  2020 Elsevier Inc.  

## 2020-05-22 LAB — CULTURE, BLOOD (ROUTINE X 2)
Culture: NO GROWTH
Culture: NO GROWTH
Special Requests: ADEQUATE

## 2020-05-24 ENCOUNTER — Encounter (HOSPITAL_COMMUNITY): Payer: Self-pay | Admitting: Emergency Medicine

## 2020-05-24 ENCOUNTER — Emergency Department (HOSPITAL_COMMUNITY)
Admission: EM | Admit: 2020-05-24 | Discharge: 2020-05-24 | Disposition: A | Discharge status: Home / Self Care | Payer: BLUE CROSS/BLUE SHIELD | Attending: Emergency Medicine | Admitting: Emergency Medicine

## 2020-05-24 DIAGNOSIS — Z79899 Other long term (current) drug therapy: Secondary | ICD-10-CM | POA: Diagnosis not present

## 2020-05-24 DIAGNOSIS — F17210 Nicotine dependence, cigarettes, uncomplicated: Secondary | ICD-10-CM | POA: Diagnosis not present

## 2020-05-24 DIAGNOSIS — F411 Generalized anxiety disorder: Secondary | ICD-10-CM | POA: Diagnosis not present

## 2020-05-24 DIAGNOSIS — T44991A Poisoning by other drug primarily affecting the autonomic nervous system, accidental (unintentional), initial encounter: Secondary | ICD-10-CM | POA: Diagnosis not present

## 2020-05-24 DIAGNOSIS — T50901A Poisoning by unspecified drugs, medicaments and biological substances, accidental (unintentional), initial encounter: Secondary | ICD-10-CM

## 2020-05-24 NOTE — ED Triage Notes (Signed)
Per EMS-3rd time this month of similar symptoms-family found her down-2 mg of Narcan given intranasally by family-obvious AC track marks-patient did not admit to any narcotics

## 2020-05-24 NOTE — ED Provider Notes (Signed)
[facility]-EMERGENCY DEPT Provider Note   CSN: [id number] Arrival date & time: 05/24/20  1323     History Chief Complaint  Patient presents with  . Ingestion    Crystal Campos is a 49 y.o. female.  49 year old female here after being found unresponsive by her family.  Patient given Narcan with good response.  Patient denies any opiate use states that she was likely using benzodiazepines.  Denies any SI or HI.  Feels back to her baseline at this time.  Has no complaints.        History reviewed. No pertinent past medical history.  Patient Active Problem List   Diagnosis Date Noted  . SIRS (systemic inflammatory response syndrome) (HCC) 05/17/2020  . 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 Acute lower UTI 05/17/2020  . GAD (generalized anxiety disorder) 05/16/2020    Past Surgical History:  Procedure Laterality Date  . ABDOMINAL HYSTERECTOMY       OB History   No obstetric history on file.     No family history on file.  Social History   Tobacco Use  . Smoking status: Current Every Day Smoker    Packs/day: 1.00    Types: Cigarettes  . Smokeless tobacco: Current User  Substance Use Topics  . Alcohol use: Yes    Comment: once per year  . Drug use: Never    Comment: pt reports never taking illegal substances    Home Medications Prior to Admission medications   Medication Sig Start Date End Date Taking? Authorizing Provider  ADDERALL XR 30 MG 24 hr capsule Take 30 mg by mouth daily. 05/04/20   [provider]  Buprenorphine HCl-Naloxone HCl 8-2 MG FILM Place under the tongue 2 (two) times daily. 05/04/20   [provider]  busPIRone (BUSPAR) 30 MG tablet Take 30 mg by mouth 2 (two) times daily. 03/14/20   [provider]  celecoxib (CELEBREX) 100 MG capsule Take 100 mg by mouth 2 (two) times daily as needed for moderate pain.  12/26/19   [provider]  clonazePAM (KLONOPIN) 0.5 MG tablet Take 0.5 mg by  mouth 3 (three) times daily as needed for anxiety.  05/12/20   [provider]  diphenhydrAMINE (BENADRYL) 25 MG tablet Take 25 mg by mouth every 6 (six) hours as needed for allergies.    [provider]  fluticasone (FLONASE) 50 MCG/ACT nasal spray Place 1 spray into both nostrils daily as needed for allergies or rhinitis.    [provider]  nicotine (NICODERM CQ - DOSED IN MG/24 HOURS) 14 mg/24hr patch Place 1 patch (14 mg total) onto the skin daily. 05/20/20   Marguerita Merles Latif, DO  pantoprazole (PROTONIX) 40 MG tablet Take 40 mg by mouth every morning. 04/08/20   [provider]  Phenyleph-CPM-DM-Aspirin (ALKA-SELTZER PLUS COLD & COUGH PO) Take 1 tablet by mouth daily as needed (cold symptoms).    [provider]  polyethylene glycol (MIRALAX / GLYCOLAX) 17 g packet Take 17 g by mouth daily as needed for mild constipation. 05/19/20   Marguerita Merles Latif, DO  sertraline (ZOLOFT) 100 MG tablet Take 200 mg by mouth daily. 02/26/20   [provider]    Allergies    Compazine [prochlorperazine] and Reglan [metoclopramide]  Review of Systems   Review of Systems  All other systems reviewed and are negative.   Physical Exam Updated Vital Signs BP 125/77 (BP Location: Right Arm)   Pulse 96   Temp 97.8 F (36.6  C) (Oral)   Resp 16   LMP  (LMP Unknown)   SpO2 96%   Physical Exam Vitals and nursing note reviewed.  Constitutional:      General: She is not in acute distress.    Appearance: Normal appearance. She is well-developed. She is not toxic-appearing.  HENT:     Head: Normocephalic and atraumatic.  Eyes:     General: Lids are normal.     Conjunctiva/sclera: Conjunctivae normal.     Pupils: Pupils are equal, round, and reactive to light.  Neck:     Thyroid: No thyroid mass.     Trachea: No tracheal deviation.  Cardiovascular:     Rate and Rhythm: Normal rate and regular rhythm.     Heart sounds: Normal heart sounds. No murmur.  No gallop.   Pulmonary:     Effort: Pulmonary effort is normal. No respiratory distress.     Breath sounds: Normal breath sounds. No stridor. No decreased breath sounds, wheezing, rhonchi or rales.  Abdominal:     General: Bowel sounds are normal. There is no distension.     Palpations: Abdomen is soft.     Tenderness: There is no abdominal tenderness. There is no rebound.  Musculoskeletal:        General: No tenderness. Normal range of motion.     Cervical back: Normal range of motion and neck supple.  Skin:    General: Skin is warm and dry.     Findings: No abrasion or rash.  Neurological:     Mental Status: She is alert and oriented to person, place, and time.     GCS: GCS eye subscore is 4. GCS verbal subscore is 5. GCS motor subscore is 6.     Cranial Nerves: No cranial nerve deficit.     Sensory: No sensory deficit.  Psychiatric:        Speech: Speech normal.        Behavior: Behavior normal.     ED Results / Procedures / Treatments   Labs (all labs ordered are listed, but only abnormal results are displayed) Labs Reviewed - No data to display  EKG None  Radiology No results found.  Procedures Procedures (including critical care time)  Medications Ordered in ED Medications - No data to display  ED Course  I have reviewed the triage vital signs and the nursing notes.  Pertinent labs & imaging results that were available during my care of the patient were reviewed by me and considered in my medical decision making (see chart for details).    MDM Rules/Calculators/A&P                      Patient's vital signs are stable here.  Will discharge Final Clinical Impression(s) / ED Diagnoses Final diagnoses:  None    Rx / DC Orders ED Discharge Orders    None       Lacretia Leigh, MD 05/24/20 1404

## 2020-05-25 ENCOUNTER — Ambulatory Visit (HOSPITAL_COMMUNITY): Payer: Self-pay

## 2020-05-25 ENCOUNTER — Telehealth (HOSPITAL_COMMUNITY): Payer: Self-pay | Admitting: Professional

## 2020-06-09 DIAGNOSIS — G5631 Lesion of radial nerve, right upper limb: Secondary | ICD-10-CM | POA: Insufficient documentation

## 2020-06-13 DIAGNOSIS — R29898 Other symptoms and signs involving the musculoskeletal system: Secondary | ICD-10-CM | POA: Insufficient documentation

## 2020-06-19 ENCOUNTER — Emergency Department (HOSPITAL_COMMUNITY)
Admission: EM | Admit: 2020-06-19 | Discharge: 2020-06-19 | Disposition: A | Discharge status: Home / Self Care | Payer: BLUE CROSS/BLUE SHIELD | Attending: Emergency Medicine | Admitting: Emergency Medicine

## 2020-06-19 ENCOUNTER — Emergency Department (HOSPITAL_COMMUNITY): Payer: BLUE CROSS/BLUE SHIELD

## 2020-06-19 ENCOUNTER — Other Ambulatory Visit: Payer: Self-pay

## 2020-06-19 DIAGNOSIS — R0789 Other chest pain: Secondary | ICD-10-CM | POA: Diagnosis not present

## 2020-06-19 DIAGNOSIS — Z79899 Other long term (current) drug therapy: Secondary | ICD-10-CM | POA: Insufficient documentation

## 2020-06-19 DIAGNOSIS — F17210 Nicotine dependence, cigarettes, uncomplicated: Secondary | ICD-10-CM | POA: Diagnosis not present

## 2020-06-19 DIAGNOSIS — I2699 Other pulmonary embolism without acute cor pulmonale: Secondary | ICD-10-CM | POA: Diagnosis not present

## 2020-06-19 DIAGNOSIS — R451 Restlessness and agitation: Secondary | ICD-10-CM | POA: Insufficient documentation

## 2020-06-19 LAB — BASIC METABOLIC PANEL
Anion gap: 12 (ref 5–15)
BUN: 11 mg/dL (ref 6–20)
CO2: 26 mmol/L (ref 22–32)
Calcium: 10.2 mg/dL (ref 8.9–10.3)
Chloride: 97 mmol/L — ABNORMAL LOW (ref 98–111)
Creatinine, Ser: 0.66 mg/dL (ref 0.44–1.00)
GFR calc Af Amer: >60 mL/min (ref >60)
GFR calc non Af Amer: >60 mL/min (ref >60)
Glucose, Bld: 109 mg/dL — ABNORMAL HIGH (ref 70–99)
Potassium: 3.8 mmol/L (ref 3.5–5.1)
Sodium: 135 mmol/L (ref 135–145)

## 2020-06-19 LAB — CBC
HCT: 41.6 % (ref 36.0–46.0)
Hemoglobin: 14 g/dL (ref 12.0–15.0)
MCH: 28.6 pg (ref 26.0–34.0)
MCHC: 33.7 g/dL (ref 30.0–36.0)
MCV: 85.1 fL (ref 80.0–100.0)
Platelets: 340 10*3/uL (ref 150–400)
RBC: 4.89 MIL/uL (ref 3.87–5.11)
RDW: 15.2 % (ref 11.5–15.5)
WBC: 15.5 10*3/uL — ABNORMAL HIGH (ref 4.0–10.5)
nRBC: 0 % (ref 0.0–0.2)

## 2020-06-19 LAB — TROPONIN I (HIGH SENSITIVITY)
Troponin I (High Sensitivity): 5 ng/L (ref <18)
Troponin I (High Sensitivity): 4 ng/L (ref <18)

## 2020-06-19 MED ORDER — LORAZEPAM 2 MG/ML IJ SOLN
1.0000 mg | Freq: Once | INTRAMUSCULAR | Status: AC
  Administered 2020-06-19: 1 mg via INTRAVENOUS
  Filled 2020-06-19: qty 1

## 2020-06-19 MED ORDER — RIVAROXABAN (XARELTO) EDUCATION KIT FOR DVT/PE PATIENTS
PACK | Freq: Once | Status: DC
  Filled 2020-06-19: qty 1

## 2020-06-19 MED ORDER — IOHEXOL 350 MG/ML SOLN
100.0000 mL | Freq: Once | INTRAVENOUS | Status: AC | PRN
  Administered 2020-06-19: 100 mL via INTRAVENOUS

## 2020-06-19 MED ORDER — RIVAROXABAN (XARELTO) VTE STARTER PACK (15 & 20 MG)
ORAL_TABLET | ORAL | 0 refills | Status: DC
Start: 2020-06-19 — End: 2024-11-19

## 2020-06-19 MED ORDER — SODIUM CHLORIDE (PF) 0.9 % IJ SOLN
INTRAMUSCULAR | Status: AC
  Filled 2020-06-19: qty 50

## 2020-06-19 MED ORDER — LORAZEPAM 1 MG PO TABS: 1.0000 mg | ORAL_TABLET | Freq: Once | ORAL | Status: DC

## 2020-06-19 NOTE — ED Provider Notes (Signed)
[facility] DEPT Provider Note   CSN: [id number] Arrival date & time: 06/19/20  0102     History Chief Complaint  Patient presents with  . Chest Pain    Crystal Campos is a 49 y.o. female.  Patient is a 49 year old female with a history of anxiety/panic attacks and tobacco use who presents with chest pain.  She says she went to care when this yesterday and thinks she got too hot.  Around 10:00 last night she started having some chest pain which she describes as feeling hot all over.  She says it felt like a pressure type feeling to her left chest and it radiates to her neck and upper back.  Its not worse with exertion.  She has no associated symptoms, other than she feels like her GERD is acting up.  Is not pleuritic.  Its not worse with movement.  No history of similar symptoms in the past.  She does have a history of panic attacks and has been feeling very anxious and panicky since this happened.  She says that she just feels hot and cannot calm down.  She cannot leave her house to get checked out earlier because she was with her grandkids.  She did feel like she had some cramping in her feet last night and took some of her potassium that she had at home.  She says she took 3 tablets of the potassium.        No past medical history on file.  Patient Active Problem List   Diagnosis Date Noted  . SIRS (systemic inflammatory response syndrome) ([facility]) 05/17/2020  . 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 Acute lower UTI 05/17/2020  . GAD (generalized anxiety disorder) 05/16/2020    Past Surgical History:  Procedure Laterality Date  . ABDOMINAL HYSTERECTOMY       OB History   No obstetric history on file.     No family history on file.  Social History   Tobacco Use  . Smoking status: Current Every Day Smoker    Packs/day: 1.00    Types: Cigarettes  . Smokeless tobacco: Current User  Substance Use Topics  . Alcohol use: Yes    Comment:  once per year  . Drug use: Never    Comment: pt reports never taking illegal substances    Home Medications Prior to Admission medications   Medication Sig Start Date End Date Taking? Authorizing Provider  ADDERALL XR 30 MG 24 hr capsule Take 30 mg by mouth daily. 05/04/20  Yes [provider]  B Complex Vitamins (B COMPLEX-B12 PO) Take 1 tablet by mouth daily.   Yes [provider]  Buprenorphine HCl-Naloxone HCl 8-2 MG FILM Place under the tongue 2 (two) times daily. 05/04/20  Yes [provider]  busPIRone (BUSPAR) 30 MG tablet Take 30 mg by mouth 2 (two) times daily. 03/14/20  Yes [provider]  clonazePAM (KLONOPIN) 0.5 MG tablet Take 0.5 mg by mouth 3 (three) times daily as needed for anxiety.  05/12/20  Yes [provider]  nicotine (NICODERM CQ - DOSED IN MG/24 HOURS) 14 mg/24hr patch Place 1 patch (14 mg total) onto the skin daily. 05/20/20  Yes Sheikh, Omair Latif, DO  pantoprazole (PROTONIX) 40 MG tablet Take 40 mg by mouth every morning. 04/08/20  Yes [provider]  polyethylene glycol (MIRALAX / GLYCOLAX) 17 g packet Take 17 g by mouth daily as needed for mild constipation. 05/19/20  Yes Raiford Noble Wagner, DO  Potassium 99 MG TABS Take 99 mg by mouth daily as needed (cramps).   Yes [provider]  sertraline (ZOLOFT) 100 MG tablet Take 200 mg by mouth daily. 02/26/20  Yes [provider]  RIVAROXABAN Alveda Reasons) VTE STARTER PACK (15 & 20 MG TABLETS) Take 15 mg by mouth 2 (two) times daily AND 20 mg daily. Follow package directions: Take one '15mg'$  tablet by mouth twice a day. On day 22, switch to one '20mg'$  tablet once a day. Take with food.. 06/19/20   Malvin Johns, MD    Allergies    Compazine [prochlorperazine] and Reglan [metoclopramide]  Review of Systems   Review of Systems  Constitutional: Negative for chills, diaphoresis, fatigue and fever.  HENT: Negative for congestion, rhinorrhea and sneezing.   Eyes:  Negative.   Respiratory: Positive for chest tightness. Negative for cough and shortness of breath.   Cardiovascular: Positive for chest pain. Negative for leg swelling.  Gastrointestinal: Negative for abdominal pain, blood in stool, diarrhea, nausea and vomiting.  Genitourinary: Negative for difficulty urinating, flank pain, frequency and hematuria.  Musculoskeletal: Negative for arthralgias and back pain.  Skin: Negative for rash.  Neurological: Negative for dizziness, speech difficulty, weakness, numbness and headaches.  Psychiatric/Behavioral: Positive for agitation.    Physical Exam Updated Vital Signs BP 111/65   Pulse 85   Temp 98.6 F (37 C) (Oral)   Resp 12   Ht '5\' 4"'$  (1.626 m)   Wt 60.8 kg   LMP  (LMP Unknown)   SpO2 96%   BMI 23.00 kg/m   Physical Exam Constitutional:      Appearance: She is well-developed.  HENT:     Head: Normocephalic and atraumatic.  Eyes:     Pupils: Pupils are equal, round, and reactive to light.  Cardiovascular:     Rate and Rhythm: Normal rate and regular rhythm.     Heart sounds: Normal heart sounds.  Pulmonary:     Effort: Pulmonary effort is normal. No respiratory distress.     Breath sounds: Normal breath sounds. No wheezing or rales.  Chest:     Chest wall: No tenderness.  Abdominal:     General: Bowel sounds are normal.     Palpations: Abdomen is soft.     Tenderness: There is no abdominal tenderness. There is no guarding or rebound.  Musculoskeletal:        General: Normal range of motion.     Cervical back: Normal range of motion and neck supple.     Comments: No edema or calf tenderness  Lymphadenopathy:     Cervical: No cervical adenopathy.  Skin:    General: Skin is warm and dry.     Findings: No rash.  Neurological:     Mental Status: She is alert and oriented to person, place, and time.     ED Results / Procedures / Treatments   Labs (all labs ordered are listed, but only abnormal results are displayed) Labs  Reviewed  BASIC METABOLIC PANEL - Abnormal; Notable for the following components:      Result Value   Chloride 97 (*)    Glucose, Bld 109 (*)    All other components within normal limits  CBC - Abnormal; Notable for the following components:   WBC 15.5 (*)    All other components within normal limits  TROPONIN I (HIGH SENSITIVITY)  TROPONIN I (HIGH SENSITIVITY)    EKG EKG Interpretation  Date/Time:  Sunday June 19 2020 07:37:29 EDT Ventricular Rate:  87 PR Interval:    QRS Duration: 81 QT Interval:  374 QTC Calculation: 450 R Axis:   77 Text Interpretation: Sinus rhythm Baseline wander in lead(s) II III aVF V2 V3 V5 12 Lead; Mason-Likar since last tracing no significant change Confirmed by Malvin Johns [phone]) on 06/19/2020 7:47:41 AM   Radiology DG Chest 2 View  Result Date: 06/19/2020 CLINICAL DATA:  49 year old female with chest pain EXAM: CHEST - 2 VIEW COMPARISON:  05/16/2020 FINDINGS: Cardiomediastinal silhouette unchanged in size and contour. No pneumothorax. Reticulonodular opacities at the bases of the lungs, new from the prior. No pleural effusion. Degenerative changes of the spine.  No displaced fracture. IMPRESSION: Reticulonodular opacities of the lungs, concerning for multifocal infection. Electronically Signed   By: Corrie Mckusick D.O.   On: 06/19/2020 08:36   CT Angio Chest PE W/Cm &/Or Wo Cm  Result Date: 06/19/2020 CLINICAL DATA:  Chest pain and shortness of breath. EXAM: CT ANGIOGRAPHY CHEST WITH CONTRAST TECHNIQUE: Multidetector CT imaging of the chest was performed using the standard protocol during bolus administration of intravenous contrast. Multiplanar CT image reconstructions and MIPs were obtained to evaluate the vascular anatomy. CONTRAST:  166m OMNIPAQUE IOHEXOL 350 MG/ML SOLN COMPARISON:  Chest x-ray dated 06/19/2020 and chest CT dated 05/16/2020 FINDINGS: Cardiovascular: A there is a tiny pulmonary embolus in a branch to the right upper lobe on image  73 of series 7 and image 80 of series 8. No other pulmonary emboli. Heart size is normal. RV LV ratio is normal. Mediastinum/Nodes: No enlarged mediastinal, hilar, or axillary lymph nodes. Thyroid gland, trachea, and esophagus demonstrate no significant findings. Lungs/Pleura: Lungs are clear. No pleural effusion or pneumothorax. Upper Abdomen: No normal spleen is present in the left upper quadrant. There is a 16 mm nodule in the left upper quadrant which may represent a splenule. Has the patient had a splenectomy? Cholecystectomy. No acute abnormalities. Musculoskeletal: No acute abnormality. Old severe compression fracture T4. Old fracture of the manubrium. Multiple healing anterior rib fractures. The patient had CPR in May 2021. The fractures were not apparent on the prior CT scan. Review of the MIP images confirms the above findings. IMPRESSION: 1. Tiny pulmonary embolus in a branch to the right upper lobe. 2. Multiple healing anterior rib fractures. Old severe compression fracture of T4. Old fracture of the manubrium. Electronically Signed   By: JLorriane ShireM.D.   On: 06/19/2020 12:25    Procedures Procedures (including critical care time)  Medications Ordered in ED Medications  sodium chloride (PF) 0.9 % injection (has no administration in time range)  rivaroxaban (XARELTO) Education Kit for DVT/PE patients (has no administration in time range)  LORazepam (ATIVAN) tablet 1 mg (has no administration in time range)  LORazepam (ATIVAN) injection 1 mg (1 mg Intravenous Given 06/19/20 0909)  iohexol (OMNIPAQUE) 350 MG/ML injection 100 mL (100 mLs Intravenous Contrast Given 06/19/20 1143)    ED Course  I have reviewed the triage vital signs and the nursing notes.  Pertinent labs & imaging results that were available during my care of the patient were reviewed by me and considered in my medical decision making (see chart for details).    MDM Rules/Calculators/A&P                           Patient is a 49year old female who presents with chest pain.  It seems to be associated with severe anxiety.  She is tearful at bedside.  She is very anxious and appears to be having a panic attack.  Her EKG does not show any ischemic changes.  She has had 2 - troponins.  She was given 1 dose of Ativan and her symptoms have resolved with this.  She denies any ongoing chest pain.  Her chest x-ray showed some nodular opacities in her lungs.  A CT scan was performed which showed no evidence of pneumonia or abnormal nodules.  However there was a tiny pulmonary embolus present in her right pulmonary artery.  This does not seem to be related to her left side chest pain.  She appears to be asymptomatic from this.  She has no tachycardia or hypoxia.  No reported shortness of breath.  She does not have any leg swelling on exam.  I feel this can be treated as an outpatient.  We will start her on Xarelto.  She has an appointment to follow-up with her PCP in 2 days.  This is a new physician that she is seeing in [place],  [place].  She also is getting a referral to follow-up with an hematologist regarding some chronic elevation in her WBC count.  Her WBC count is noted to be elevated today but is slightly better than on prior values.  Her other labs are nonconcerning.  She has normal renal function.  She was discharged home in good condition.  Return precautions were given.  Of note, her CT scan showed some healing rib fractures.  She reports that about a year ago she was in [facility] and fractured some ribs but have not had any injury since that time. Final Clinical Impression(s) / ED Diagnoses Final diagnoses:  Atypical chest pain  Acute pulmonary embolism without acute cor pulmonale, unspecified pulmonary embolism type ([place])    Rx / DC Orders ED Discharge Orders         Ordered    RIVAROXABAN (XARELTO) VTE STARTER PACK (15 & 20 MG TABLETS)     Discontinue  Reprint     06/19/20 1434           Malvin Johns, MD 06/19/20 1438

## 2020-06-19 NOTE — ED Triage Notes (Signed)
Patient reports she went to [employer] yesterday and thinks she got too hot. States she has left sides chest pain radiating toward left arm. Started at 10pm last night. Patient says she has n/v but thinks it is due to her acid reflux. Chest pain rated 10/10. ekg in triage NSR. BP 149/87.

## 2020-06-19 NOTE — Discharge Instructions (Signed)
Need to follow-up with your primary care doctor regarding ongoing management of your pulmonary embolus.  You may need more evaluation for possible clotting disorders.  Return to the emergency room if you have any worsening symptoms including shortness of breath, chest pain or other worsening symptoms.

## 2021-03-13 IMAGING — CR DG CHEST 2V
2 series · 2 of 2 positions shown · non-contrast
Comparison: 05/16/2020

CLINICAL DATA: 49-year-old female with chest pain

EXAM:
CHEST - 2 VIEW

[w chest pa]
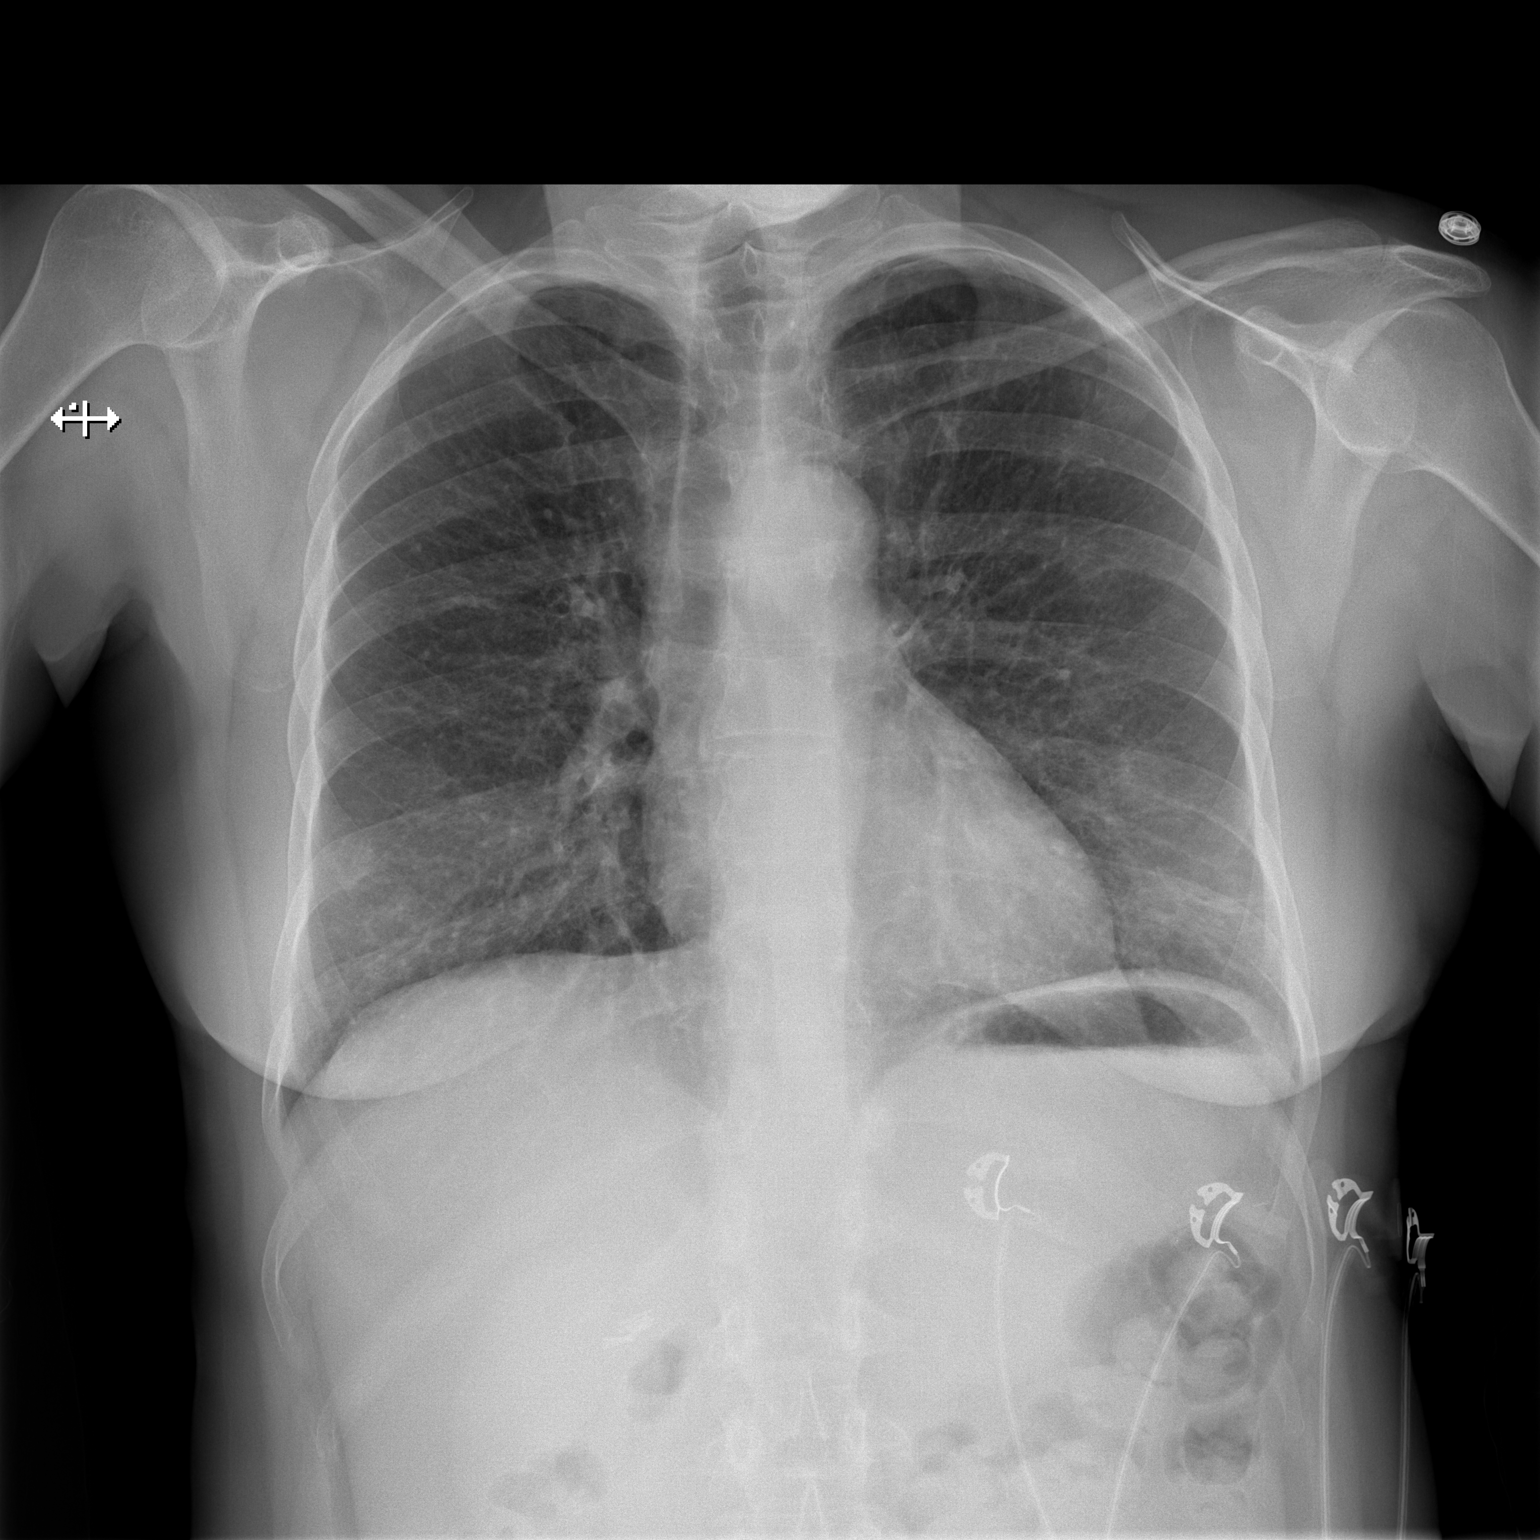

[w chest lat]
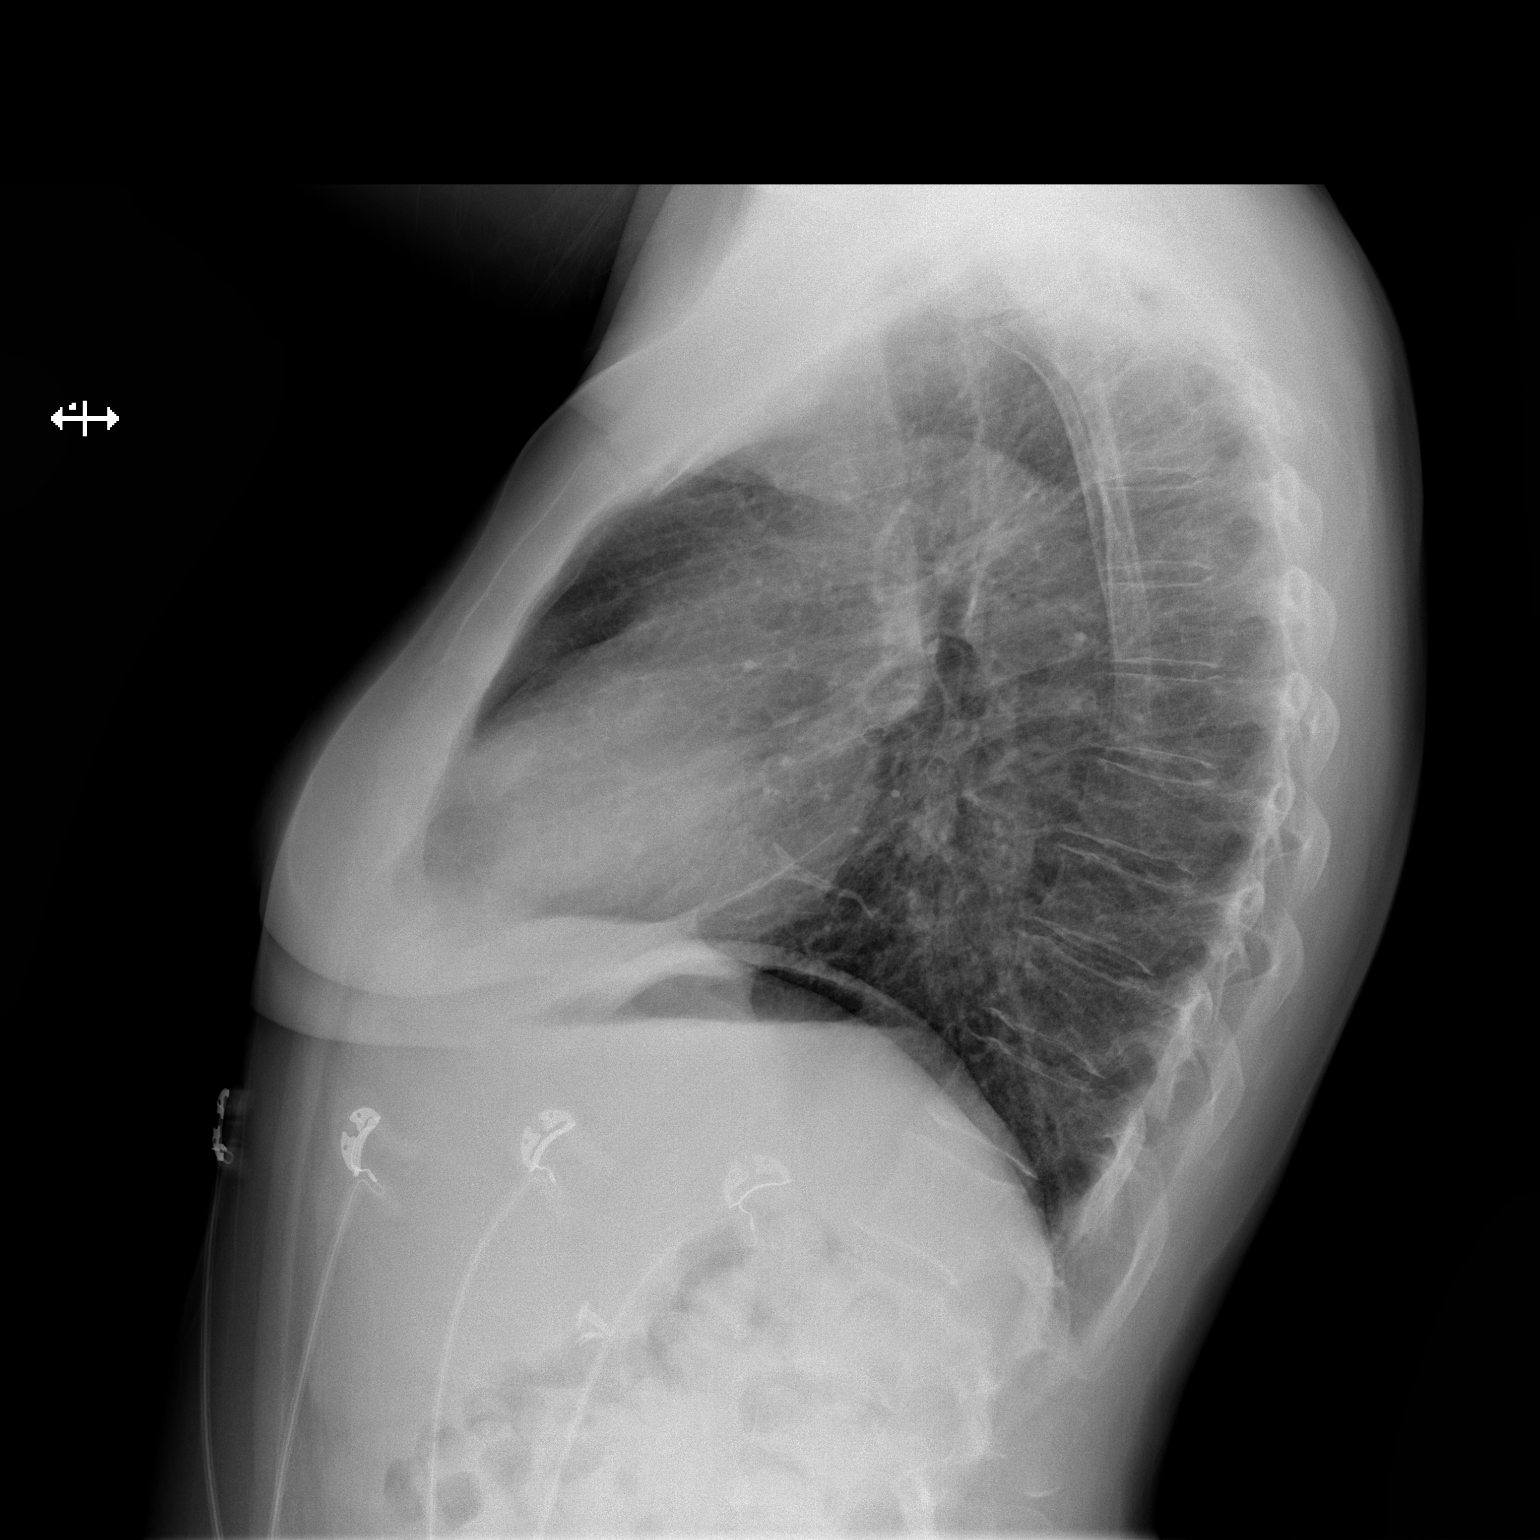

[2 of 2 positions shown; findings below may reference images not displayed]

FINDINGS: Cardiomediastinal silhouette unchanged in size and contour. No
pneumothorax.

Reticulonodular opacities at the bases of the lungs, new from the
prior.

No pleural effusion.

Degenerative changes of the spine.  No displaced fracture.
IMPRESSION: Reticulonodular opacities of the lungs, concerning for multifocal
infection.

## 2021-10-24 DIAGNOSIS — F330 Major depressive disorder, recurrent, mild: Secondary | ICD-10-CM | POA: Insufficient documentation

## 2021-10-24 DIAGNOSIS — F900 Attention-deficit hyperactivity disorder, predominantly inattentive type: Secondary | ICD-10-CM | POA: Insufficient documentation

## 2021-10-24 DIAGNOSIS — M2550 Pain in unspecified joint: Secondary | ICD-10-CM | POA: Insufficient documentation

## 2021-10-24 DIAGNOSIS — E038 Other specified hypothyroidism: Secondary | ICD-10-CM | POA: Insufficient documentation

## 2021-10-24 DIAGNOSIS — E663 Overweight: Secondary | ICD-10-CM | POA: Insufficient documentation

## 2021-10-24 DIAGNOSIS — F1111 Opioid abuse, in remission: Secondary | ICD-10-CM | POA: Insufficient documentation

## 2022-06-21 DIAGNOSIS — M3501 Sicca syndrome with keratoconjunctivitis: Secondary | ICD-10-CM | POA: Insufficient documentation

## 2024-11-19 ENCOUNTER — Encounter: Payer: Self-pay | Admitting: Family Medicine

## 2024-11-19 ENCOUNTER — Ambulatory Visit: Payer: Self-pay | Admitting: Family Medicine

## 2024-11-19 VITALS — BP 137/79 | HR 100 | Temp 97.7°F | Ht 64.0 in | Wt 147.0 lb

## 2024-11-19 DIAGNOSIS — M3501 Sicca syndrome with keratoconjunctivitis: Secondary | ICD-10-CM

## 2024-11-19 DIAGNOSIS — F1111 Opioid abuse, in remission: Secondary | ICD-10-CM

## 2024-11-19 DIAGNOSIS — Z23 Encounter for immunization: Secondary | ICD-10-CM

## 2024-11-19 DIAGNOSIS — F900 Attention-deficit hyperactivity disorder, predominantly inattentive type: Secondary | ICD-10-CM | POA: Diagnosis not present

## 2024-11-19 DIAGNOSIS — K219 Gastro-esophageal reflux disease without esophagitis: Secondary | ICD-10-CM | POA: Insufficient documentation

## 2024-11-19 DIAGNOSIS — Z136 Encounter for screening for cardiovascular disorders: Secondary | ICD-10-CM

## 2024-11-19 DIAGNOSIS — Z130 Encounter for screening for diseases of the blood and blood-forming organs and certain disorders involving the immune mechanism: Secondary | ICD-10-CM

## 2024-11-19 LAB — BAYER DCA HB A1C WAIVED: HB A1C (BAYER DCA - WAIVED): 5.5 % (ref 4.8–5.6)

## 2024-11-19 NOTE — Progress Notes (Signed)
 New Patient Office Visit  Patient ID: Crystal Campos, Female   DOB: 02/26/1971 53 y.o. MRN: [id number]  Chief Complaint  Patient presents with   Establish Care   Hypertension   Subjective:     Crystal Campos presents to establish care  Hypertension    Discussed the use of AI scribe software for clinical note transcription with the patient, who gave verbal consent to proceed.  History of Present Illness   Crystal Campos is a 53 year old female here today to establish care.   Hx of Sjogren's syndrome, ADHD, and opioid abuse. Patient was recently started on lisinopril by addiction specialist for elevated BPs.    Opiod abuse - Currently prescribed suboxone  by addiction specialist  ADHD -Currently prescribed Adderall by addiction specialist  Hypertension - Elevated blood pressure during monthly checks with addition specialist.  - On lisinopril for approximately 1.5 months; dosage unknown - Not monitoring blood pressure at home despite having a cuff  Sjogren's syndrome - Dryness in eyes and mouth - No recent rheumatology follow-up due to lack of insurance  Gastrointestinal symptoms - Occasional use of over-the-counter Prevacid for reflux.   Hysterectomy - Reports hx of total hysterectomy due to endometriosis and repeat ovarian cysts  Functional status - Employed at [employer] for three years, working two to three days per week - Job involves lots of walking - Enjoys work       Outpatient Encounter Medications as of 11/19/2024  Medication Sig   ADDERALL XR 30 MG 24 hr capsule Take 30 mg by mouth daily.   Buprenorphine  HCl-Naloxone  HCl 8-2 MG FILM Place under the tongue 2 (two) times daily.   lisinopril (ZESTRIL) 5 MG tablet Take 5 mg by mouth daily.   naloxone  (NARCAN ) nasal spray 4 mg/0.1 mL naloxone  4 mg/actuation nasal spray TAKE 1 SPRAY AS NEEDED BY NASAL ROUTE AS NEEDED.   sertraline (ZOLOFT) 100 MG tablet Take 200 mg by mouth daily.   [DISCONTINUED] B  Complex Vitamins (B COMPLEX-B12 PO) Take 1 tablet by mouth daily.   [DISCONTINUED] busPIRone (BUSPAR) 30 MG tablet Take 30 mg by mouth 2 (two) times daily.   [DISCONTINUED] clonazePAM (KLONOPIN) 0.5 MG tablet Take 0.5 mg by mouth 3 (three) times daily as needed for anxiety.    [DISCONTINUED] nicotine  (NICODERM CQ  - DOSED IN MG/24 HOURS) 14 mg/24hr patch Place 1 patch (14 mg total) onto the skin daily.   [DISCONTINUED] pantoprazole (PROTONIX) 40 MG tablet Take 40 mg by mouth every morning.   [DISCONTINUED] polyethylene glycol (MIRALAX  / GLYCOLAX ) 17 g packet Take 17 g by mouth daily as needed for mild constipation.   [DISCONTINUED] Potassium 99 MG TABS Take 99 mg by mouth daily as needed (cramps).   [DISCONTINUED] RIVAROXABAN  (XARELTO ) VTE STARTER PACK (15 & 20 MG TABLETS) Take 15 mg by mouth 2 (two) times daily AND 20 mg daily. Follow package directions: Take one 15mg  tablet by mouth twice a day. On day 22, switch to one 20mg  tablet once a day. Take with food..   [DISCONTINUED] valACYclovir (VALTREX) 1000 MG tablet Take 1,000 mg by mouth 3 (three) times daily.   No facility-administered encounter medications on file as of 11/19/2024.    Past Medical History:  Diagnosis Date   Anxiety    Depression    Hypertension     Past Surgical History:  Procedure Laterality Date   ABDOMINAL HYSTERECTOMY      Family History  Problem Relation Age of Onset   Hypertension Mother  High Cholesterol Mother    Heart attack Father    Heart attack Maternal Grandmother    Cancer Paternal Grandfather     Social History   Socioeconomic History   Marital status: Widowed    Spouse name: Not on file   Number of children: Not on file   Years of education: Not on file   Highest education level: Associate degree: occupational, scientist, product/process development, or vocational program  Occupational History   Not on file  Tobacco Use   Smoking status: Former    Current packs/day: 0.00    Types: Cigarettes    Quit date: 2022     Years since quitting: 3.8   Smokeless tobacco: Never  Vaping Use   Vaping status: Some Days  Substance and Sexual Activity   Alcohol use: Yes    Comment: Very seldom- only on special occassion   Drug use: Not Currently    Types: Other-see comments    Comment: 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   Sexual activity: Not Currently  Other Topics Concern   Not on file  Social History Narrative   Not on file   Social Drivers of Health   Financial Resource Strain: Low Risk  (11/18/2024)   Overall Financial Resource Strain (CARDIA)    Difficulty of Paying Living Expenses: Not hard at all  Food Insecurity: No Food Insecurity (11/18/2024)   Hunger Vital Sign    Worried About Running Out of Food in the Last Year: Never true    Ran Out of Food in the Last Year: Never true  Transportation Needs: No Transportation Needs (11/18/2024)   PRAPARE - Administrator, Civil Service (Medical): No    Lack of Transportation (Non-Medical): No  Physical Activity: Insufficiently Active (11/18/2024)   Exercise Vital Sign    Days of Exercise per Week: 4 days    Minutes of Exercise per Session: 20 min  Stress: Stress Concern Present (11/18/2024)   [employer] of Occupational Health - Occupational Stress Questionnaire    Feeling of Stress: Rather much  Social Connections: Moderately Isolated (11/18/2024)   Social Connection and Isolation Panel    Frequency of Communication with Friends and Family: Twice a week    Frequency of Social Gatherings with Friends and Family: Once a week    Attends Religious Services: 1 to 4 times per year    Active Member of [employer] or Organizations: No    Attends Banker Meetings: Not on file    Marital Status: Widowed  Intimate Partner Violence: Unknown (04/06/2022)   Received from [facility]   HITS    Physically Hurt: Not on file    Insult or Talk Down To: Not on file    Threaten Physical Harm: Not on file    Scream or Curse: Not on file     ROS    Objective:    BP 137/79   Pulse 100   Temp 97.7 F (36.5 C)   Ht 5' 4 (1.626 m)   Wt 147 lb (66.7 kg)   LMP  (LMP Unknown)   SpO2 97%   BMI 25.23 kg/m   Physical Exam Vitals reviewed.  Constitutional:      Appearance: Normal appearance.  HENT:     Head: Normocephalic and atraumatic.  Eyes:     Extraocular Movements: Extraocular movements intact.     Conjunctiva/sclera: Conjunctivae normal.     Pupils: Pupils are equal, round, and reactive to light.  Cardiovascular:  Rate and Rhythm: Normal rate and regular rhythm.     Pulses: Normal pulses.     Heart sounds: Normal heart sounds. No murmur heard. Pulmonary:     Effort: Pulmonary effort is normal. No respiratory distress.     Breath sounds: Normal breath sounds.  Musculoskeletal:        General: No deformity. Normal range of motion.     Cervical back: Normal range of motion and neck supple.  Skin:    General: Skin is warm and dry.     Capillary Refill: Capillary refill takes less than 2 seconds.     Comments: Hypopigmented spots to BUE  Neurological:     General: No focal deficit present.     Mental Status: She is alert and oriented to person, place, and time.  Psychiatric:        Mood and Affect: Mood normal.        Behavior: Behavior normal.          Assessment & Plan:   Problem List Items Addressed This Visit       Other   Attention deficit hyperactivity disorder (ADHD), predominantly inattentive type - Primary   History of opioid abuse (HCC)   Sjogren syndrome with keratoconjunctivitis   Relevant Orders   Ambulatory referral to Rheumatology   Other Visit Diagnoses       Screening for endocrine, nutritional, metabolic and immunity disorder       Relevant Orders   Lipid Panel   TSH   T4, Free   CBC with Differential   Bayer DCA Hb A1c Waived   Comprehensive metabolic panel with GFR     Encounter for screening for cardiovascular disorders       Relevant Orders   Lipid Panel      Encounter for immunization       Relevant Orders   Flu vaccine trivalent PF, 6mos and older(Flulaval,Afluria,Fluarix,Fluzone) (Completed)       Assessment and Plan    Hypertension Blood pressure slightly elevated at 137/79 mmHg today. Started on lisinopril 1.5 months ago (patient unsure of dose).  - Monitor blood pressure at home daily for 4-5 days after resting 10 minutes. - Report home readings and lisinopril dose via phone or MyChart. - Consider increasing lisinopril or adding second antihypertensive if readings remain >130/80 mmHg. - Lab work obtained, results pending.   Sjogren syndrome with keratoconjunctivitis Sjogren syndrome with dry eyes and mouth. No recent rheumatology follow-up. - Referred to rheumatology for follow-up. - Recommended eye examination   General Health Maintenance - Received flu vaccine today - F/u in 3 months for annual physical     Opiod abuse and ADHD - Continue f/u with addiction specialist. Currently taking adderall and suboxone .   Return in about 3 months (around 02/19/2025) for annual physical.   Oneil LELON Severin, FNP [facility]

## 2024-11-20 ENCOUNTER — Other Ambulatory Visit: Payer: Self-pay | Admitting: Family Medicine

## 2024-11-20 ENCOUNTER — Ambulatory Visit: Payer: Self-pay | Admitting: Family Medicine

## 2024-11-20 DIAGNOSIS — E7800 Pure hypercholesterolemia, unspecified: Secondary | ICD-10-CM

## 2024-11-20 DIAGNOSIS — D649 Anemia, unspecified: Secondary | ICD-10-CM

## 2024-11-20 LAB — CBC WITH DIFFERENTIAL/PLATELET
Basophils Absolute: 0.2 x10E3/uL (ref 0.0–0.2)
Basos: 1 %
EOS (ABSOLUTE): 0.4 x10E3/uL (ref 0.0–0.4)
Eos: 3 %
Hematocrit: 32.5 % — ABNORMAL LOW (ref 34.0–46.6)
Hemoglobin: 10.1 g/dL — ABNORMAL LOW (ref 11.1–15.9)
Immature Grans (Abs): 0 x10E3/uL (ref 0.0–0.1)
Immature Granulocytes: 0 %
Lymphocytes Absolute: 7.5 x10E3/uL — ABNORMAL HIGH (ref 0.7–3.1)
Lymphs: 50 %
MCH: 24 pg — ABNORMAL LOW (ref 26.6–33.0)
MCHC: 31.1 g/dL — ABNORMAL LOW (ref 31.5–35.7)
MCV: 77 fL — ABNORMAL LOW (ref 79–97)
Monocytes Absolute: 0.9 x10E3/uL (ref 0.1–0.9)
Monocytes: 6 %
Neutrophils Absolute: 6 x10E3/uL (ref 1.4–7.0)
Neutrophils: 40 %
Platelets: 447 x10E3/uL (ref 150–450)
RBC: 4.21 x10E6/uL (ref 3.77–5.28)
RDW: 17 % — ABNORMAL HIGH (ref 11.7–15.4)
WBC: 15 x10E3/uL — ABNORMAL HIGH (ref 3.4–10.8)

## 2024-11-20 LAB — T4, FREE: Free T4: 0.83 ng/dL (ref 0.82–1.77)

## 2024-11-20 LAB — LIPID PANEL
Chol/HDL Ratio: 7.2 ratio — ABNORMAL HIGH (ref 0.0–4.4)
Cholesterol, Total: 329 mg/dL — ABNORMAL HIGH (ref 100–199)
HDL: 46 mg/dL (ref >39)
LDL Chol Calc (NIH): 243 mg/dL — ABNORMAL HIGH (ref 0–99)
Triglycerides: 201 mg/dL — ABNORMAL HIGH (ref 0–149)
VLDL Cholesterol Cal: 40 mg/dL (ref 5–40)

## 2024-11-20 LAB — COMPREHENSIVE METABOLIC PANEL WITH GFR
ALT: 20 IU/L (ref 0–32)
AST: 30 IU/L (ref 0–40)
Albumin: 4.6 g/dL (ref 3.8–4.9)
Alkaline Phosphatase: 117 IU/L (ref 49–135)
BUN/Creatinine Ratio: 15 (ref 9–23)
BUN: 11 mg/dL (ref 6–24)
Bilirubin Total: 0.2 mg/dL (ref 0.0–1.2)
CO2: 23 mmol/L (ref 20–29)
Calcium: 9.7 mg/dL (ref 8.7–10.2)
Chloride: 98 mmol/L (ref 96–106)
Creatinine, Ser: 0.72 mg/dL (ref 0.57–1.00)
Globulin, Total: 3.1 g/dL (ref 1.5–4.5)
Glucose: 88 mg/dL (ref 70–99)
Potassium: 4 mmol/L (ref 3.5–5.2)
Sodium: 137 mmol/L (ref 134–144)
Total Protein: 7.7 g/dL (ref 6.0–8.5)
eGFR: 99 mL/min/1.73 (ref >59)

## 2024-11-20 LAB — TSH: TSH: 3.85 u[IU]/mL (ref 0.450–4.500)

## 2024-11-24 ENCOUNTER — Other Ambulatory Visit

## 2024-11-25 ENCOUNTER — Other Ambulatory Visit

## 2024-11-25 DIAGNOSIS — D649 Anemia, unspecified: Secondary | ICD-10-CM

## 2024-11-26 LAB — IRON,TIBC AND FERRITIN PANEL
Ferritin: 11 ng/mL — ABNORMAL LOW (ref 15–150)
Iron Saturation: 5 % — CL (ref 15–55)
Iron: 20 ug/dL — ABNORMAL LOW (ref 27–159)
Total Iron Binding Capacity: 406 ug/dL (ref 250–450)
UIBC: 386 ug/dL (ref 131–425)

## 2024-12-01 ENCOUNTER — Ambulatory Visit: Payer: Self-pay | Admitting: Family Medicine

## 2024-12-01 ENCOUNTER — Other Ambulatory Visit: Payer: Self-pay | Admitting: Family Medicine

## 2024-12-01 DIAGNOSIS — D509 Iron deficiency anemia, unspecified: Secondary | ICD-10-CM

## 2024-12-01 MED ORDER — FERROUS SULFATE 325 (65 FE) MG PO TBEC: 325.0000 mg | DELAYED_RELEASE_TABLET | Freq: Every day | ORAL | 0 refills | Status: DC

## 2024-12-02 ENCOUNTER — Other Ambulatory Visit: Payer: Self-pay | Admitting: Family Medicine

## 2024-12-02 DIAGNOSIS — E611 Iron deficiency: Secondary | ICD-10-CM

## 2024-12-29 ENCOUNTER — Other Ambulatory Visit

## 2024-12-29 DIAGNOSIS — E611 Iron deficiency: Secondary | ICD-10-CM

## 2024-12-30 ENCOUNTER — Other Ambulatory Visit: Payer: Self-pay | Admitting: Family Medicine

## 2024-12-30 ENCOUNTER — Ambulatory Visit: Payer: Self-pay | Admitting: Family Medicine

## 2024-12-30 DIAGNOSIS — D509 Iron deficiency anemia, unspecified: Secondary | ICD-10-CM

## 2024-12-30 LAB — IRON,TIBC AND FERRITIN PANEL
Ferritin: 22 ng/mL (ref 15–150)
Iron Saturation: 7 % — CL (ref 15–55)
Iron: 26 ug/dL — ABNORMAL LOW (ref 27–159)
Total Iron Binding Capacity: 384 ug/dL (ref 250–450)
UIBC: 358 ug/dL (ref 131–425)

## 2025-01-05 ENCOUNTER — Other Ambulatory Visit

## 2025-01-05 DIAGNOSIS — D509 Iron deficiency anemia, unspecified: Secondary | ICD-10-CM

## 2025-01-05 LAB — CBC WITH DIFFERENTIAL/PLATELET
Basophils Absolute: 0.2 x10E3/uL (ref 0.0–0.2)
Basos: 1 %
EOS (ABSOLUTE): 0.7 x10E3/uL — ABNORMAL HIGH (ref 0.0–0.4)
Eos: 5 %
Hematocrit: 38.1 % (ref 34.0–46.6)
Hemoglobin: 11.7 g/dL (ref 11.1–15.9)
Immature Grans (Abs): 0 x10E3/uL (ref 0.0–0.1)
Immature Granulocytes: 0 %
Lymphocytes Absolute: 6.6 x10E3/uL — ABNORMAL HIGH (ref 0.7–3.1)
Lymphs: 52 %
MCH: 25.3 pg — ABNORMAL LOW (ref 26.6–33.0)
MCHC: 30.7 g/dL — ABNORMAL LOW (ref 31.5–35.7)
MCV: 83 fL (ref 79–97)
Monocytes Absolute: 0.9 x10E3/uL (ref 0.1–0.9)
Monocytes: 7 %
Neutrophils Absolute: 4.4 x10E3/uL (ref 1.4–7.0)
Neutrophils: 35 %
Platelets: 378 x10E3/uL (ref 150–450)
RBC: 4.62 x10E6/uL (ref 3.77–5.28)
RDW: 21.2 % — ABNORMAL HIGH (ref 11.7–15.4)
WBC: 12.7 x10E3/uL — ABNORMAL HIGH (ref 3.4–10.8)

## 2025-01-06 ENCOUNTER — Ambulatory Visit: Payer: Self-pay | Admitting: Family Medicine

## 2025-01-07 ENCOUNTER — Encounter (HOSPITAL_BASED_OUTPATIENT_CLINIC_OR_DEPARTMENT_OTHER): Payer: Self-pay | Admitting: Internal Medicine

## 2025-01-07 ENCOUNTER — Ambulatory Visit (INDEPENDENT_AMBULATORY_CARE_PROVIDER_SITE_OTHER): Admitting: Internal Medicine

## 2025-01-07 ENCOUNTER — Telehealth (HOSPITAL_BASED_OUTPATIENT_CLINIC_OR_DEPARTMENT_OTHER): Payer: Self-pay | Admitting: *Deleted

## 2025-01-07 VITALS — BP 176/94 | HR 99 | Ht 64.0 in | Wt 144.8 lb

## 2025-01-07 DIAGNOSIS — E7849 Other hyperlipidemia: Secondary | ICD-10-CM

## 2025-01-07 DIAGNOSIS — I2510 Atherosclerotic heart disease of native coronary artery without angina pectoris: Secondary | ICD-10-CM | POA: Diagnosis not present

## 2025-01-07 DIAGNOSIS — I10 Essential (primary) hypertension: Secondary | ICD-10-CM | POA: Diagnosis not present

## 2025-01-07 DIAGNOSIS — I700 Atherosclerosis of aorta: Secondary | ICD-10-CM | POA: Diagnosis not present

## 2025-01-07 MED ORDER — LISINOPRIL 20 MG PO TABS: 20.0000 mg | ORAL_TABLET | Freq: Every day | ORAL | 3 refills | Status: AC

## 2025-01-07 MED ORDER — ROSUVASTATIN CALCIUM 20 MG PO TABS: 20.0000 mg | ORAL_TABLET | Freq: Every day | ORAL | 3 refills | Status: AC

## 2025-01-07 NOTE — Progress Notes (Signed)
 "   LIPID CLINIC CONSULT NOTE  Chief Complaint:  Manage dyslipidemia  Primary Care Physician: Alcus Oneil ORN, FNP  Primary Cardiologist:  None  HPI:  Crystal Campos is a 54 y.o. female who is being seen today for the evaluation of dyslipidemia at the request of Alcus Oneil ORN, FNP. This is a pleasant 54 year old female kindly referred for evaluation management of dyslipidemia.  Recently she was noted to have significant dyslipidemia with total cholesterol 329, triglycerides 201, HDL 46 and LDL 243.  She reports a strong family history of heart disease including her father who had an MI at age 6.  She was a former smoker and quit about 4 years ago.  She had had CT scans of the chest in the past which showed scattered coronary atherosclerosis and aortic atherosclerosis.  She has not been on any lipid-lowering therapy.  She also is hypertensive and was started on some lisinopril  about 3 months ago but only 5 mg.  Blood pressure today was 176/94.  She denies any chest pain or shortness of breath.  PMHx:  Past Medical History:  Diagnosis Date   Anxiety    Depression    Hypertension     Past Surgical History:  Procedure Laterality Date   ABDOMINAL HYSTERECTOMY      FAMHx:  Family History  Problem Relation Age of Onset   Hypertension Mother    High Cholesterol Mother    Heart attack Father    Heart attack Maternal Grandmother    Cancer Paternal Grandfather     SOCHx:   reports that she quit smoking about 4 years ago. Her smoking use included cigarettes. She has never used smokeless tobacco. She reports current alcohol use. She reports that she does not currently use drugs after having used the following drugs: Other-see comments.  ALLERGIES:  Allergies[1]  ROS: Pertinent items noted in HPI and remainder of comprehensive ROS otherwise negative.  HOME MEDS: Medications Ordered Prior to Encounter[2]  LABS/IMAGING: Results for orders placed or performed in visit on  01/05/25 (from the past 48 hours)  CBC with Differential     Status: Abnormal   Collection Time: 01/05/25  1:32 PM  Result Value Ref Range   WBC 12.7 (H) 3.4 - 10.8 x10E3/uL   RBC 4.62 3.77 - 5.28 x10E6/uL   Hemoglobin 11.7 11.1 - 15.9 g/dL   Hematocrit 61.8 65.9 - 46.6 %   MCV 83 79 - 97 fL   MCH 25.3 (L) 26.6 - 33.0 pg   MCHC 30.7 (L) 31.5 - 35.7 g/dL   RDW 78.7 (H) 88.2 - 84.5 %   Platelets 378 150 - 450 x10E3/uL   Neutrophils 35 Not Estab. %   Lymphs 52 Not Estab. %   Monocytes 7 Not Estab. %   Eos 5 Not Estab. %   Basos 1 Not Estab. %   Neutrophils Absolute 4.4 1.4 - 7.0 x10E3/uL   Lymphocytes Absolute 6.6 (H) 0.7 - 3.1 x10E3/uL   Monocytes Absolute 0.9 0.1 - 0.9 x10E3/uL   EOS (ABSOLUTE) 0.7 (H) 0.0 - 0.4 x10E3/uL   Basophils Absolute 0.2 0.0 - 0.2 x10E3/uL   Immature Granulocytes 0 Not Estab. %   Immature Grans (Abs) 0.0 0.0 - 0.1 x10E3/uL   No results found.  LIPID PANEL:    Component Value Date/Time   CHOL 329 (H) 11/19/2024 1435   TRIG 201 (H) 11/19/2024 1435   HDL 46 11/19/2024 1435   CHOLHDL 7.2 (H) 11/19/2024 1435   LDLCALC 243 (H)  11/19/2024 1435    No results found for: LIPOA   WEIGHTS: Wt Readings from Last 3 Encounters:  01/07/25 144 lb 12.8 oz (65.7 kg)  11/19/24 147 lb (66.7 kg)  06/19/20 134 lb (60.8 kg)    VITALS: BP (!) 176/94   Pulse 99   Ht 5' 4 (1.626 m)   Wt 144 lb 12.8 oz (65.7 kg)   LMP  (LMP Unknown)   SpO2 98%   BMI 24.85 kg/m   EXAM: Deferred  EKG: Deferred  ASSESSMENT: Probable familial hyperlipidemia, LDL greater than 190 Family history of premature onset coronary disease in a first relative Former smoker Uncontrolled hypertension Coronary artery calcification and aortic atherosclerosis  PLAN: 1.   Ms. Keegan has a probable familial hyperlipidemia with LDL greater than 190.  She also has been noted to have coronary calcification and aortic atherosclerosis.  This is evidence of coronary artery disease.  Based  on that she needs aggressive lipid-lowering.  I would recommend starting rosuvastatin  20 mg daily.  Blood pressure is also poorly controlled on low-dose lisinopril .  Would increase that to 20 mg daily.  She says her home blood pressure readings are around 140-150 systolic.  Target blood pressure is 120/80.  I will plan repeat lipids including NMR and LP(a) in about 3 months.  In addition I have advised genetic testing.  I think this will be helpful to further restratify her.  She is in agreement with that.  Will send out a genetic test today which takes about 3 weeks to get back.  She understands it is typically covered by insurance however if not may lead to no more than a $299 charge.  I will reach out to her via MyChart in a few weeks with those results.  Thanks again for the kind referral.  Vinie KYM Maxcy, MD, [facility], FNLA, FACP  [facility]  Medical Director of the Advanced Lipid Disorders &  Cardiovascular Risk Reduction Clinic Diplomate of the American Board of Clinical Lipidology Attending Cardiologist  Direct Dial: [phone]  Fax: [phone]  Website:  [email or web address]  Vinie BROCKS Shauntell Iglesia 01/07/2025, 1:25 PM      [1]  Allergies Allergen Reactions   Ibuprofen Anaphylaxis, Hives and Other (See Comments)    hypotension  hypotension  hypotension  hypotension, hypotension   Ketorolac Anaphylaxis and Other (See Comments)    --- UPDATE ---  Legacy System: CCA  Onset Date: <blank>  Substance Legacy/Cerner: Toradol / Toradol (Legacy value)  Category (from Default Category): DRUG  Severity Legacy/Cerner: <blank> / Unknown  Reaction(s): anxious  Comments: Added as CodifiedLegacy System: CCA  Onset Date: <blank>  Substance Legacy/Cerner: Toradol / Toradol (Legacy value)  Category: Drug  Severity Legacy/Cerner: <blank> / Unknown  Reaction(s): <blank>  Comments: Replaced free text allergy    --- UPDATE ---  Legacy System: CCA  Onset Date: <blank>   Substance Legacy/Cerner: Toradol / Toradol (Legacy value)  Category (from Default Category): DRUG  Severity Legacy/Cerner: <blank> / Unknown  Reaction(s): anxious  Comments: Added as CodifiedLegacy System: CCA  Onset Date: <blank>  Substance Legacy/Cerner: Toradol / Toradol (Legacy value)  Category: Drug  Severity Legacy/Cerner: <blank> / Unknown  Reaction(s): <blank>  Comments: Replaced free text allergy    --- UPDATE ---  Legacy System: CCA  Onset Date: <blank>  Substance Legacy/Cerner: Toradol / Toradol (Legacy value)  Category (from Default Category): DRUG  Severity Legacy/Cerner: <blank> / Unknown  Reaction(s): anxious  Comments: Added as CodifiedLegacy System: CCA  Onset Date: <  blank>  Substance Legacy/Cerner: Toradol / Toradol (Legacy value)  Category: Drug  Severity Legacy/Cerner: <blank> / Unknown  Reaction(s): <blank>  Comments: Replaced free text allergy    --- UPDATE ---  Legacy System: CCA  Onset Date: <blank>  Substance Legacy/Cerner: Toradol / Toradol (Legacy value)  Category (from Default Category): DRUG  Severity Legacy/Cerner: <blank> / Unknown  Reaction(s): anxious  Comments: Added as CodifiedLegacy System: CCA  Onset Date: <blank>  Substance Legacy/Cerner: Toradol / Toradol (Legacy value)  Category: Drug  Severity Legacy/Cerner: <blank> / Unknown  Reaction(s): <blank>  Comments: Replaced free text allergy  --- UPDATE ---, Legacy System: CCA, Onset Date: <blank>, Substance Legacy/Cerner: Toradol / Toradol (Legacy value), Category (from Default Category): DRUG, Severity Legacy/Cerner: <blank> / Unknown, Reaction(s): anxious, Comments: Added as CodifiedLegacy System: CCA, Onset Date: <blank>, Substance Legacy/Cerner: Toradol / Toradol (Legacy value), Category: Drug, Severity Legacy/Cerner: <blank> / Unknown, Reaction(s): <blank>, Comments: Replaced free text allergy, , --- UPDATE ---, Legacy System: CCA, Ons   Naproxen Anaphylaxis   Diphenhydramine Other  (See Comments)    Legacy System: CCA  Onset Date: <blank>  Substance Legacy/Cerner: diphenhydrAMINE / diphenhydrAMINE (Legacy value)  Category: Drug  Severity Legacy/Cerner: <blank> / Unknown  Reaction(s): crazy  anxiety increases  Comments: <blank>    Legacy System: CCA  Onset Date: <blank>  Substance Legacy/Cerner: diphenhydrAMINE / diphenhydrAMINE (Legacy value)  Category: Drug  Severity Legacy/Cerner: <blank> / Unknown  Reaction(s): crazy  anxiety increases  Comments: <blank>    Legacy System: CCA  Onset Date: <blank>  Substance Legacy/Cerner: diphenhydrAMINE / diphenhydrAMINE (Legacy value)  Category: Drug  Severity Legacy/Cerner: <blank> / Unknown  Reaction(s): crazy  anxiety increases  Comments: <blank>    Legacy System: CCA  Onset Date: <blank>  Substance Legacy/Cerner: diphenhydrAMINE / diphenhydrAMINE (Legacy value)  Category: Drug  Severity Legacy/Cerner: <blank> / Unknown  Reaction(s): crazy  anxiety increases  Comments: <blank>  Legacy System: CCA, Onset Date: <blank>, Substance Legacy/Cerner: diphenhydrAMINE / diphenhydrAMINE (Legacy value), Category: Drug, Severity Legacy/Cerner: <blank> / Unknown, Reaction(s): crazy  anxiety increases, Comments: <blank>, , Legacy System: CCA, Onset Date: <blank>, Substance Legacy/Cerner: diphenhydrAMINE / diphenhydrAMINE (Legacy value), Category: Drug, Severity Legacy/Cerner: <blank> / Unknown, Reaction(s): crazy  anxiety increases, Comments: <blank>   Compazine [Prochlorperazine] Other (See Comments)    Agitation    Reglan [Metoclopramide] Other (See Comments)    agitation  [2]  Current Outpatient Medications on File Prior to Visit  Medication Sig Dispense Refill   ADDERALL XR 30 MG 24 hr capsule Take 30 mg by mouth daily.     Buprenorphine  HCl-Naloxone  HCl 8-2 MG FILM Place under the tongue 2 (two) times daily.     ferrous sulfate  325 (65 FE) MG EC tablet Take 1 tablet (325 mg total) by mouth daily. 60 tablet 0    lisinopril  (ZESTRIL ) 5 MG tablet Take 5 mg by mouth daily.     naloxone  (NARCAN ) nasal spray 4 mg/0.1 mL naloxone  4 mg/actuation nasal spray TAKE 1 SPRAY AS NEEDED BY NASAL ROUTE AS NEEDED.     sertraline (ZOLOFT) 100 MG tablet Take 200 mg by mouth daily.     No current facility-administered medications on file prior to visit.   "

## 2025-01-07 NOTE — Telephone Encounter (Signed)
 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 ID: [id number]

## 2025-01-07 NOTE — Patient Instructions (Signed)
 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 ID: [id number]   Follow-Up: At [facility], you and your health needs are our priority.  As part of our continuing mission to provide you with exceptional heart care, our providers are all part of one team.  This team includes your primary Cardiologist (physician) and Advanced Practice Providers or APPs (Physician Assistants and Nurse Practitioners) who all work together to provide you with the care you need, when you need it.  Your next appointment:   3 month(s)  Provider:   Rosaline Bane, NP in Lipid Clinic

## 2025-01-14 ENCOUNTER — Other Ambulatory Visit

## 2025-01-15 LAB — BASIC METABOLIC PANEL WITH GFR
BUN/Creatinine Ratio: 16 (ref 9–23)
BUN: 13 mg/dL (ref 6–24)
CO2: 22 mmol/L (ref 20–29)
Calcium: 9.4 mg/dL (ref 8.7–10.2)
Chloride: 100 mmol/L (ref 96–106)
Creatinine, Ser: 0.79 mg/dL (ref 0.57–1.00)
Glucose: 115 mg/dL — ABNORMAL HIGH (ref 70–99)
Potassium: 3.9 mmol/L (ref 3.5–5.2)
Sodium: 139 mmol/L (ref 134–144)
eGFR: 89 mL/min/1.73

## 2025-01-18 ENCOUNTER — Ambulatory Visit: Payer: Self-pay | Admitting: Internal Medicine

## 2025-01-28 ENCOUNTER — Other Ambulatory Visit: Payer: Self-pay | Admitting: Family Medicine

## 2025-01-28 DIAGNOSIS — D509 Iron deficiency anemia, unspecified: Secondary | ICD-10-CM

## 2025-02-19 ENCOUNTER — Ambulatory Visit: Admitting: Family Medicine

## 2025-04-12 ENCOUNTER — Ambulatory Visit

## 2025-04-14 ENCOUNTER — Encounter (HOSPITAL_BASED_OUTPATIENT_CLINIC_OR_DEPARTMENT_OTHER): Admitting: Nurse Practitioner
